# Patient Record
Sex: Female | Born: 1955 | Race: White | Hispanic: No | Smoking: Never smoker
Health system: Southern US, Community
[De-identification: ages and names within clinical notes are randomized; demographics above are authoritative.]

## PROBLEM LIST (undated history)

## (undated) DIAGNOSIS — M1990 Unspecified osteoarthritis, unspecified site: Secondary | ICD-10-CM

## (undated) DIAGNOSIS — T8859XA Other complications of anesthesia, initial encounter: Secondary | ICD-10-CM

## (undated) DIAGNOSIS — I10 Essential (primary) hypertension: Secondary | ICD-10-CM

## (undated) DIAGNOSIS — M7580 Other shoulder lesions, unspecified shoulder: Secondary | ICD-10-CM

## (undated) DIAGNOSIS — E11610 Type 2 diabetes mellitus with diabetic neuropathic arthropathy: Secondary | ICD-10-CM

## (undated) DIAGNOSIS — M7122 Synovial cyst of popliteal space [Baker], left knee: Secondary | ICD-10-CM

## (undated) DIAGNOSIS — T4145XA Adverse effect of unspecified anesthetic, initial encounter: Secondary | ICD-10-CM

## (undated) DIAGNOSIS — I739 Peripheral vascular disease, unspecified: Secondary | ICD-10-CM

## (undated) DIAGNOSIS — G709 Myoneural disorder, unspecified: Secondary | ICD-10-CM

## (undated) DIAGNOSIS — E7800 Pure hypercholesterolemia, unspecified: Secondary | ICD-10-CM

## (undated) HISTORY — PX: CHOLECYSTECTOMY: SHX55

## (undated) HISTORY — PX: OTHER SURGICAL HISTORY: SHX169

---

## 2011-09-10 ENCOUNTER — Inpatient Hospital Stay (HOSPITAL_COMMUNITY)
Admission: AD | Admit: 2011-09-10 | Discharge: 2011-09-14 | DRG: 639 | Disposition: A | Discharge status: Home / Self Care | Payer: Medicare Other | Source: Ambulatory Visit | Attending: Internal Medicine | Admitting: Internal Medicine

## 2011-09-10 ENCOUNTER — Encounter (HOSPITAL_COMMUNITY): Payer: Self-pay

## 2011-09-10 ENCOUNTER — Inpatient Hospital Stay (HOSPITAL_COMMUNITY): Payer: Medicare Other

## 2011-09-10 DIAGNOSIS — E1165 Type 2 diabetes mellitus with hyperglycemia: Principal | ICD-10-CM | POA: Diagnosis present

## 2011-09-10 DIAGNOSIS — I10 Essential (primary) hypertension: Secondary | ICD-10-CM | POA: Diagnosis present

## 2011-09-10 DIAGNOSIS — Z794 Long term (current) use of insulin: Secondary | ICD-10-CM

## 2011-09-10 DIAGNOSIS — E1149 Type 2 diabetes mellitus with other diabetic neurological complication: Secondary | ICD-10-CM | POA: Diagnosis present

## 2011-09-10 DIAGNOSIS — E1142 Type 2 diabetes mellitus with diabetic polyneuropathy: Secondary | ICD-10-CM | POA: Diagnosis present

## 2011-09-10 DIAGNOSIS — L97509 Non-pressure chronic ulcer of other part of unspecified foot with unspecified severity: Secondary | ICD-10-CM | POA: Diagnosis present

## 2011-09-10 DIAGNOSIS — E039 Hypothyroidism, unspecified: Secondary | ICD-10-CM | POA: Diagnosis present

## 2011-09-10 DIAGNOSIS — E669 Obesity, unspecified: Secondary | ICD-10-CM | POA: Diagnosis present

## 2011-09-10 DIAGNOSIS — K5900 Constipation, unspecified: Secondary | ICD-10-CM | POA: Diagnosis present

## 2011-09-10 DIAGNOSIS — E785 Hyperlipidemia, unspecified: Secondary | ICD-10-CM | POA: Diagnosis present

## 2011-09-10 DIAGNOSIS — IMO0002 Reserved for concepts with insufficient information to code with codable children: Principal | ICD-10-CM | POA: Diagnosis present

## 2011-09-10 DIAGNOSIS — M1990 Unspecified osteoarthritis, unspecified site: Secondary | ICD-10-CM | POA: Diagnosis present

## 2011-09-10 DIAGNOSIS — Z23 Encounter for immunization: Secondary | ICD-10-CM

## 2011-09-10 HISTORY — DX: Other complications of anesthesia, initial encounter: T88.59XA

## 2011-09-10 HISTORY — DX: Peripheral vascular disease, unspecified: I73.9

## 2011-09-10 HISTORY — DX: Essential (primary) hypertension: I10

## 2011-09-10 HISTORY — DX: Unspecified osteoarthritis, unspecified site: M19.90

## 2011-09-10 HISTORY — DX: Pure hypercholesterolemia, unspecified: E78.00

## 2011-09-10 HISTORY — DX: Other shoulder lesions, unspecified shoulder: M75.80

## 2011-09-10 HISTORY — DX: Type 2 diabetes mellitus with diabetic neuropathic arthropathy: E11.610

## 2011-09-10 HISTORY — DX: Myoneural disorder, unspecified: G70.9

## 2011-09-10 HISTORY — DX: Adverse effect of unspecified anesthetic, initial encounter: T41.45XA

## 2011-09-10 LAB — CBC
HCT: 36.3 % (ref 36.0–46.0)
Hemoglobin: 12.7 g/dL (ref 12.0–15.0)
MCH: 30.5 pg (ref 26.0–34.0)
MCHC: 35 g/dL (ref 30.0–36.0)
RDW: 12.4 % (ref 11.5–15.5)

## 2011-09-10 LAB — GLUCOSE, CAPILLARY
Glucose-Capillary: 424 mg/dL — ABNORMAL HIGH (ref 70–99)
Glucose-Capillary: 358 mg/dL — ABNORMAL HIGH (ref 70–99)

## 2011-09-10 MED ORDER — LISINOPRIL 10 MG PO TABS
20.0000 mg | ORAL_TABLET | Freq: Every day | ORAL | Status: DC
  Administered 2011-09-11 – 2011-09-14 (×4): 20 mg via ORAL
  Filled 2011-09-10: qty 1
  Filled 2011-09-10 (×2): qty 2
  Filled 2011-09-10 (×3): qty 1
  Filled 2011-09-10: qty 2

## 2011-09-10 MED ORDER — PNEUMOCOCCAL VAC POLYVALENT 25 MCG/0.5ML IJ INJ
0.5000 mL | INJECTION | INTRAMUSCULAR | Status: AC
  Administered 2011-09-11: 0.5 mL via INTRAMUSCULAR
  Filled 2011-09-10: qty 0.5

## 2011-09-10 MED ORDER — INSULIN GLARGINE 100 UNIT/ML ~~LOC~~ SOLN
30.0000 [IU] | Freq: Every day | SUBCUTANEOUS | Status: DC
  Administered 2011-09-10: 30 [IU] via SUBCUTANEOUS
  Filled 2011-09-10: qty 3

## 2011-09-10 MED ORDER — INSULIN ASPART 100 UNIT/ML ~~LOC~~ SOLN
0.0000 [IU] | Freq: Every day | SUBCUTANEOUS | Status: DC
  Administered 2011-09-10: 5 [IU] via SUBCUTANEOUS

## 2011-09-10 MED ORDER — SODIUM CHLORIDE 0.9 % IV SOLN
INTRAVENOUS | Status: DC
  Administered 2011-09-10: 23:00:00 via INTRAVENOUS
  Administered 2011-09-11: 75 mL via INTRAVENOUS
  Administered 2011-09-12: 04:00:00 via INTRAVENOUS

## 2011-09-10 MED ORDER — CITALOPRAM HYDROBROMIDE 20 MG PO TABS
20.0000 mg | ORAL_TABLET | Freq: Every day | ORAL | Status: DC
  Administered 2011-09-10 – 2011-09-14 (×5): 20 mg via ORAL
  Filled 2011-09-10 (×5): qty 1

## 2011-09-10 MED ORDER — GABAPENTIN 300 MG PO CAPS
600.0000 mg | ORAL_CAPSULE | Freq: Three times a day (TID) | ORAL | Status: DC
  Administered 2011-09-10 – 2011-09-14 (×11): 600 mg via ORAL
  Filled 2011-09-10 (×4): qty 2
  Filled 2011-09-10: qty 1
  Filled 2011-09-10: qty 2
  Filled 2011-09-10: qty 1
  Filled 2011-09-10: qty 2
  Filled 2011-09-10: qty 1
  Filled 2011-09-10 (×2): qty 2
  Filled 2011-09-10: qty 1
  Filled 2011-09-10: qty 2

## 2011-09-10 MED ORDER — INSULIN ASPART 100 UNIT/ML ~~LOC~~ SOLN
15.0000 [IU] | Freq: Once | SUBCUTANEOUS | Status: AC
  Administered 2011-09-10: 15 [IU] via SUBCUTANEOUS

## 2011-09-10 MED ORDER — INSULIN ASPART 100 UNIT/ML ~~LOC~~ SOLN
0.0000 [IU] | Freq: Three times a day (TID) | SUBCUTANEOUS | Status: DC
  Administered 2011-09-11: 15 [IU] via SUBCUTANEOUS
  Administered 2011-09-11: 11 [IU] via SUBCUTANEOUS
  Administered 2011-09-12 (×2): 3 [IU] via SUBCUTANEOUS
  Administered 2011-09-12: 2 [IU] via SUBCUTANEOUS
  Administered 2011-09-13: 5 [IU] via SUBCUTANEOUS
  Administered 2011-09-13: 3 [IU] via SUBCUTANEOUS
  Administered 2011-09-14: 8 [IU] via SUBCUTANEOUS
  Administered 2011-09-14: 3 [IU] via SUBCUTANEOUS
  Filled 2011-09-10: qty 3

## 2011-09-10 MED ORDER — SODIUM CHLORIDE 0.9 % IJ SOLN
3.0000 mL | INTRAMUSCULAR | Status: DC | PRN
  Filled 2011-09-10: qty 3

## 2011-09-10 MED ORDER — CYCLOBENZAPRINE HCL 10 MG PO TABS
10.0000 mg | ORAL_TABLET | Freq: Two times a day (BID) | ORAL | Status: DC
  Administered 2011-09-10 – 2011-09-14 (×8): 10 mg via ORAL
  Filled 2011-09-10 (×8): qty 1

## 2011-09-10 MED ORDER — HYDROCODONE-ACETAMINOPHEN 5-325 MG PO TABS
1.5000 | ORAL_TABLET | Freq: Four times a day (QID) | ORAL | Status: DC | PRN
  Administered 2011-09-10 – 2011-09-14 (×8): 1.5 via ORAL
  Filled 2011-09-10 (×8): qty 2

## 2011-09-11 ENCOUNTER — Inpatient Hospital Stay (HOSPITAL_COMMUNITY): Payer: Medicare Other

## 2011-09-11 LAB — GLUCOSE, CAPILLARY
Glucose-Capillary: 300 mg/dL — ABNORMAL HIGH (ref 70–99)
Glucose-Capillary: 215 mg/dL — ABNORMAL HIGH (ref 70–99)

## 2011-09-11 LAB — COMPREHENSIVE METABOLIC PANEL
Albumin: 3.4 g/dL — ABNORMAL LOW (ref 3.5–5.2)
BUN: 25 mg/dL — ABNORMAL HIGH (ref 6–23)
Calcium: 9.6 mg/dL (ref 8.4–10.5)
GFR calc Af Amer: 45 mL/min — ABNORMAL LOW (ref >60)
Glucose, Bld: 282 mg/dL — ABNORMAL HIGH (ref 70–99)
Potassium: 4.1 mEq/L (ref 3.5–5.1)
Total Protein: 7.7 g/dL (ref 6.0–8.3)

## 2011-09-11 LAB — LIPID PANEL
HDL: 40 mg/dL (ref >39)
LDL Cholesterol: 81 mg/dL (ref 0–99)
Triglycerides: 223 mg/dL — ABNORMAL HIGH (ref <150)
VLDL: 45 mg/dL — ABNORMAL HIGH (ref 0–40)

## 2011-09-11 LAB — HEMOGLOBIN A1C
Hgb A1c MFr Bld: 13.5 % — ABNORMAL HIGH (ref <5.7)
Mean Plasma Glucose: 341 mg/dL — ABNORMAL HIGH (ref <117)

## 2011-09-11 MED ORDER — INSULIN ASPART 100 UNIT/ML ~~LOC~~ SOLN
15.0000 [IU] | Freq: Three times a day (TID) | SUBCUTANEOUS | Status: DC
  Administered 2011-09-11 – 2011-09-14 (×9): 15 [IU] via SUBCUTANEOUS

## 2011-09-11 MED ORDER — INSULIN GLARGINE 100 UNIT/ML ~~LOC~~ SOLN
60.0000 [IU] | Freq: Every day | SUBCUTANEOUS | Status: DC
  Administered 2011-09-11 – 2011-09-12 (×2): 60 [IU] via SUBCUTANEOUS

## 2011-09-11 NOTE — Consult Note (Signed)
  [id number] 

## 2011-09-11 NOTE — Progress Notes (Signed)
NAME:  Theresa Estrada, CARRANZA NO.:  [id number]  MEDICAL RECORD NO.:  [id number]  LOCATION:  A325                          FACILITY:  [facility]  PHYSICIAN:  Aiman Sonn D. Felecia Shelling, MD   DATE OF BIRTH:  Jul 22, 1956  DATE OF PROCEDURE:  09/11/2011 DATE OF DISCHARGE:                                PROGRESS NOTE   SUBJECTIVE:  The patient continued to complain of right foot pain.  She has foot ulcer which is currently being dressed.  Her blood sugar is slightly coming down.  OBJECTIVE:  GENERAL:  The patient is alert, awake and chronically sick looking. VITAL SIGNS:  Blood pressure 138/78, pulse 78, respiratory rate 16, temperature 98 degrees Fahrenheit. CHEST:  Decreased air entry.  Few rhonchi. CARDIOVASCULAR SYSTEM:  First and second heart sounds heard.  No murmur. No gallop. ABDOMEN:  Soft and lax.  Bowel sound is positive.  No mass or organomegaly.  EXTREMITIES:  The patient has wound with eschar on her right foot on the plantar side.  LABORATORY DATA:  Blood sugar 282.  ASSESSMENT: 1. Diabetes mellitus type 2, poorly controlled. 2. Right foot ulcer. 3. Diabetic neuropathy. 4. Hypertension. 5. Hyperlipidemia.  PLAN:  We will continue the patient on current insulin therapy.  We will do x-ray of the foot.  Continue wound care.  Endocrine consult is requested.     Laurell Coalson D. Felecia Shelling, MD     TDF/MEDQ  D:  09/11/2011  T:  09/11/2011  Job:  [id number]

## 2011-09-11 NOTE — Progress Notes (Signed)
UR Chart Review Completed  

## 2011-09-11 NOTE — H&P (Signed)
NAME:  ARON, NEEDLES NO.:  [id number]  MEDICAL RECORD NO.:  [id number]  LOCATION:  A325                          FACILITY:  [facility]  PHYSICIAN:  Riely Baskett D. Felecia Shelling, MD   DATE OF BIRTH:  November 25, 1956  DATE OF ADMISSION:  09/10/2011 DATE OF DISCHARGE:  LH                             HISTORY & PHYSICAL   CHIEF COMPLAINTS:  Uncontrolled diabetes mellitus and right foot ulcer.  HISTORY OF PRESENT ILLNESS:  This is a 55 year old female patient with history of multiple medical illnesses who was directly admitted from the office due to the above complaints.  The patient was recently moved from [place] and she is in [place], [place] currently since July of this year.  The patient has not been able to get a primary care and she has not been seeing a physician.  She came to establish to my office yesterday and the patient was found to have a blood sugar of greater than 500.  She has also right foot nonhealing wound which is getting worse.  The patient was evaluated in the office and was directly admitted for management of her diabetes and foot ulcer.  The patient complains of pain in her lower extremities and severe neuropathy.  REVIEW OF SYSTEMS:  No fever, chills, cough, chest pain, nausea, vomiting.  No abdominal pain, dysuria, urgency, or frequency of urination.  PAST MEDICAL HISTORY: 1. Diabetes mellitus. 2. Hypertension. 3. Hyperlipidemia. 4. Severe neuropathy. 5. History of Charcot foot. 6. Osteoarthritis. 7. Obesity.  CURRENT MEDICATIONS: 1. Lisinopril/HCTZ, dose not known. 2. Flexeril 10 mg b.i.d. 3. Hydrocortisone/APAP 7.5/500 one tablet t.i.d. 4. Tramadol 50 mg b.i.d. 5. Metformin 500 mg b.i.d. 6. Lasix 20 mg daily. 7. Neurontin 500 mg daily. 8. Insulin 70/30, 55 units in a.m. and 55 units in p.m. 9. Celexa 20 mg daily. 10,  Meloxicam 7.5 mg p.o. daily.  SOCIAL HISTORY:  The patient is separated.  She has no history of alcohol, tobacco, or substance  abuse.  FAMILY HISTORY:  Her father and grandfather has diabetes mellitus.  No known disease on her maternal side.  PHYSICAL EXAMINATION:  GENERAL:  The patient is alert and awake, and chronically sick looking. VITAL SIGNS:  On admission, blood pressure 140/80, pulse 88, respiratory rate 20, temperature 98 degrees Fahrenheit. HEENT:  Pupils are equal and reactive. NECK:  Supple. CHEST:  Decreased air entry, few rhonchi. CARDIOVASCULAR:  First and second heart sounds heard.  No murmur, no gallop. ABDOMEN:  Soft and lax.  Obese.  No mass or organomegaly. EXTREMITIES:  The patient has wound on her plantar side of her right foot with some eschar and swelling.  LABS ON ADMISSION:  Glucose initially 424, sodium 133, potassium 4.1, chloride 95, carbon dioxide 27, BUN 25, creatinine 1.46, calcium 9.6, total protein 7.7, albumin 3.4.  Hemoglobin A1c result pending.  CBC: WBC 10.4, hemoglobin 12.7, hematocrit 36.3, and platelets 313.  ASSESSMENT: 1. Diabetes mellitus type 2, poorly controlled. 2. Diabetic foot ulcer. 3. Severe diabetic neuropathy. 4. Hypertension. 5. Hyperlipidemia. 6. Obesity. 7. Osteoarthritis.  PLAN:  We will continue the patient on sliding scale insulin and Lantus insulin at bedtime.  We will initiate wound care.  We will do Endocrine consult and we will do Wound Care consult as well.  We will continue the patient on her regular medications, and we will continue to monitor her electrolytes and blood sugar level.     John Vasconcelos D. Felecia Shelling, MD     TDF/MEDQ  D:  09/11/2011  T:  09/11/2011  Job:  [id number]

## 2011-09-11 NOTE — Consult Note (Signed)
Pt with history of bilateral Charcot foot and degenerative changes and chronic full thickness wound to right plantar foot near bottom of great toe area.  She has previously been followed by a podiatrist in [place] but moved here recently and has not had any follow-up care since July.  She has worn a contact cast in the past to promote healing, and states in the past several weeks, her wound became larger and had mod tan drainage and odor.  She has hx of  neuropathy.  Xray does not indicate osteomyelitis, but shows possible gout changes.  Pt admits that she has seen small crystals in the wound bed at times but did not know what they were. None visible at present.  Wound full thickness, 2X2X.3cm, 90% red with dark black rim and callous formation surrounding wound bed.  Trimmed callous with scalpel and removed black edges without pain or bleeding. Mod tan drainage, slight odor.  Plan:  Aquacel to absorb drainage and provide antimicrobial benefits.  Pt could benefit from a referral to a podiatrist or outpatient wound care center upon D/C.  Will not plan to follow further unless reconsulted.  Thank-you, Cammie Mcgee MSN, [employer]

## 2011-09-11 NOTE — Plan of Care (Signed)
Problem: Consults Goal: Diagnosis-Diabetes Mellitus Outcome: Progressing Hyperglycemia     

## 2011-09-12 LAB — BASIC METABOLIC PANEL
CO2: 27 mEq/L (ref 19–32)
Chloride: 102 mEq/L (ref 96–112)
Creatinine, Ser: 0.86 mg/dL (ref 0.50–1.10)
GFR calc Af Amer: >60 mL/min (ref >60)
Potassium: 5 mEq/L (ref 3.5–5.1)

## 2011-09-12 LAB — GLUCOSE, CAPILLARY
Glucose-Capillary: 187 mg/dL — ABNORMAL HIGH (ref 70–99)
Glucose-Capillary: 147 mg/dL — ABNORMAL HIGH (ref 70–99)
Glucose-Capillary: 112 mg/dL — ABNORMAL HIGH (ref 70–99)

## 2011-09-12 MED ORDER — LEVOTHYROXINE SODIUM 50 MCG PO TABS
50.0000 ug | ORAL_TABLET | Freq: Every day | ORAL | Status: DC
  Administered 2011-09-12 – 2011-09-14 (×3): 50 ug via ORAL
  Filled 2011-09-12 (×3): qty 1

## 2011-09-12 MED ORDER — MAGNESIUM HYDROXIDE 400 MG/5ML PO SUSP
60.0000 mL | Freq: Every day | ORAL | Status: DC | PRN
  Administered 2011-09-12: 60 mL via ORAL
  Filled 2011-09-12: qty 60

## 2011-09-12 NOTE — Consult Note (Signed)
NAME:  Theresa, Estrada NO.:  [id number]  MEDICAL RECORD NO.:  [id number]  LOCATION:  A325                          FACILITY:  [facility]  PHYSICIAN:  Purcell Nails, MD DATE OF BIRTH:  23-Feb-1956  DATE OF CONSULTATION: DATE OF DISCHARGE:                                CONSULTATION   REASON FOR CONSULTATION:  Uncontrolled type 2 diabetes.  HISTORY OF PRESENT ILLNESS:  This is a 55 year old Caucasian female with multiple medical problems including type 2 diabetes since 2007, complicated by peripheral arterial disease, Charcot arthritis with recent hemoglobin A1c of 10+%.  The patient was admitted to her primary care physician's office yesterday with complaint of diabetes and also uncontrolled hyperglycemia.  The patient reports that she recently moved from [place] and residing near [place].  She has been on insulin Novolin 70/30 55 units t.i.d. and metformin 1000 mg b.i.d. since the diagnosis of diabetes since 2007.  The patient was found to have nonhealing foot ulcer in the office.  She admits that she does not monitor her blood sugar regularly.  She has family history of diabetes in her father and grandmother, and has not seen endocrinologist in the past.  PAST MEDICAL HISTORY:  As above including type 2 diabetes, hypertension, Charcot arthritis, hyperlipidemia, severe neuropathy, osteoarthritis and obesity.  HOME MEDICATIONS:  Lisinopril, Flexeril, hydrocodone, tramadol, metformin, Lasix, Neurontin, Novolin 70/30 55 units t.i.d., Celexa, meloxicam.  SOCIAL HISTORY:  Negative for smoking, alcohol, or drug use.  FAMILY HISTORY:  Significant for type 2 diabetes in her father and grandmother and also premature coronary artery disease in her father.  PHYSICAL EXAMINATION:  GENERAL:  She is alert and oriented x3, stable in her postop day.  No acute distress. VITAL SIGNS:  Showed blood pressure of 140/80, pulse 88. HEENT:  No icterus, no pallor, atraumatic. NECK:   Thick, otherwise nontender.  No thyromegaly. CHEST:  Clear to auscultation bilaterally; however, decreased air entry. CARDIOVASCULAR:  Distant heart sounds.  No murmur.  No gallop. ABDOMEN:  Obese, no tenderness, no organomegaly. EXTREMITIES:  She has superficial-looking circular diabetic foot ulcer on her right lower extremity and clearly deformed Charcot foot on bilateral feet. CNS:  Nonfocal; however, diminished monofilament test.  Her admission laboratory exam shows blood glucose is 400, sodium 133, potassium 4.9, chloride 99, BUN 25, creatinine 1.46.  Hemoglobin A1c pending.  ASSESSMENT: 1. Uncontrolled type 2 diabetes was complicated by diabetic foot ulcer     and Charcot foot and severe peripheral neuropathy. 2. Hypertension. 3. Hyperlipidemia. 4. Obesity. 5. Osteoarthritis.  PLAN:  We will continue bolus insulin in the hospital; however, increasing Lantus to 60 units nightly and initiate NovoLog 15 units t.i.d. a.c. along with moderate dose sliding scale using the NovoLog. Avoid oral antidiabetic in house.  Depending on her fingerstick readings and A1c, her insulin regimen will be adjusted.  I have ordered the patient outpatient follow up in 1 week time to check and control her diabetes to avoid complications including worsening peripheral arterial disease, coronary artery disease, CVA and EKG which are all discussed in detail with her.  I have consider for dietary precautions in relation to her diabetes as well.  Dear Dr.  Fanta, thank your for the opportunity to participate in the care of this pleasant patient.  I will update you her progress.          ______________________________ Purcell Nails, MD     GN/MEDQ  D:  09/11/2011  T:  09/12/2011  Job:  [id number]

## 2011-09-12 NOTE — Progress Notes (Signed)
Subjective: F/u for uncontrolled type 2 DM. She has no new complaints. Objective:  Vital signs in last 24 hours: Filed Vitals:   09/11/11 1400 09/11/11 2218 09/12/11 0500  BP: 110/69 154/114 135/83  Pulse: 66 86 66  Temp:  97.6 F (36.4 C) 97.4 F (36.3 C)  TempSrc:  Axillary Oral  Resp: 18 20   SpO2: 96% 97% 93%    Intake/Output Summary (Last 24 hours) at 09/12/11 6045 Last data filed at 09/11/11 2100  Gross per 24 hour  Intake 1946.25 ml  Output    500 ml  Net 1446.25 ml   Weight change:   GENERAL: She is alert and oriented x3, stable in  her postop day. No acute distress.  VITAL SIGNS: Showed blood pressure of 140/80, pulse 88.  HEENT: No icterus, no pallor, atraumatic.  NECK: Thick, otherwise nontender. No thyromegaly.  CHEST: Clear to auscultation bilaterally; however, decreased air entry.  CARDIOVASCULAR: Distant heart sounds. No murmur. No gallop.  ABDOMEN: Obese, no tenderness, no organomegaly.  EXTREMITIES: She has superficial-looking circular diabetic foot ulcer  on her right lower extremity and clearly deformed Charcot foot on  bilateral feet.  CNS: Nonfocal; however, diminished monofilament test.   Lab Results: Basic Metabolic Panel:  Basename 09/12/11 0546 09/10/11 2316  NA 136 133*  K 5.0 4.1  CL 102 95*  CO2 27 27  GLUCOSE 193* 282*  BUN 23 25*  CREATININE 0.86 1.46*  CALCIUM 9.2 9.6  MG -- --  PHOS -- --   Liver Function Tests:  Basename 09/10/11 2316  AST 13  ALT 15  ALKPHOS 122*  BILITOT 0.2*  PROT 7.7  ALBUMIN 3.4*    CBC:  Basename 09/10/11 2316  WBC 10.4  NEUTROABS --  HGB 12.7  HCT 36.3  MCV 87.1  PLT 313    Basename 09/12/11 0741 09/11/11 2102 09/11/11 1654 09/11/11 1142 09/11/11 0757 09/10/11 2150  GLUCAP 187* 233* 215* 359* 300* 358*   Hemoglobin A1C:  Basename 09/10/11 2316  HGBA1C 13.5*   Fasting Lipid Panel:  Basename 09/11/11 1001  CHOL 166  HDL 40  LDLCALC 81  TRIG 223*  CHOLHDL 4.2  LDLDIRECT  --   Thyroid Function Tests:  Basename 09/11/11 1001  TSH 7.195*  T4TOTAL --  T3FREE --  THYROIDAB --     Medications:  Scheduled Meds:   . citalopram  20 mg Oral Daily  . cyclobenzaprine  10 mg Oral BID  . gabapentin  600 mg Oral TID  . insulin aspart  0-15 Units Subcutaneous TID WC  . insulin aspart  15 Units Subcutaneous TID WC  . insulin glargine  60 Units Subcutaneous QHS  . lisinopril  20 mg Oral Daily  . pneumococcal 23 valent vaccine  0.5 mL Intramuscular Tomorrow-1000  . DISCONTD: insulin aspart  0-5 Units Subcutaneous QHS  . DISCONTD: insulin glargine  30 Units Subcutaneous QHS   Continuous Infusions:   . DISCONTD: sodium chloride 75 mL/hr at 09/12/11 0427   PRN Meds:.HYDROcodone-acetaminophen, sodium chloride  Assessment/Plan: 1. Uncontrolled type 2 diabetes was complicated by diabetic foot ulcer  and Charcot foot and severe peripheral neuropathy.  2. Hypertension.  3. Hyperlipidemia.  4. Obesity.  5. Osteoarthritis. 6. Hypothyroidism   Plan: continue current insulin therapy. Will initiate Levothyroxine 50 mcg po qam. Will continue to follow.   LOS: 2 days   Theresa Estrada 09/12/2011, 8:21 AM

## 2011-09-12 NOTE — Progress Notes (Addendum)
INITIAL ADULT NUTRITION ASSESSMENT Date: 09/12/2011   Time: 8:55 AM Reason for Assessment: Poorly controlled DM  ASSESSMENT: Female 55 y.o.  Dx: <principal problem not specified> Hyperglycemia   Past Medical History  Diagnosis Date  . Complication of anesthesia   . Diabetes mellitus   . Hypertension   . Peripheral vascular disease   . Arthritis   . Hypercholesterolemia   . Charcot foot due to diabetes mellitus to both feet  . Neuromuscular disorder   . 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 citalopram  20 mg Oral Daily  . cyclobenzaprine  10 mg Oral BID  . gabapentin  600 mg Oral TID  . insulin aspart  0-15 Units Subcutaneous TID WC  . insulin aspart  15 Units Subcutaneous TID WC  . insulin glargine  60 Units Subcutaneous QHS  . levothyroxine  50 mcg Oral QAC breakfast  . lisinopril  20 mg Oral Daily  . pneumococcal 23 valent vaccine  0.5 mL Intramuscular Tomorrow-1000  . DISCONTD: insulin aspart  0-5 Units Subcutaneous QHS  . DISCONTD: insulin glargine  30 Units Subcutaneous QHS   Continuous Infusions:   . DISCONTD: sodium chloride 75 mL/hr at 09/12/11 0427   PRN Meds:.HYDROcodone-acetaminophen, sodium chloride  Ht:  69"  Wt:  300#  Ideal Wt:    145# % Ideal Wt: 207%  Usual Wt: Pt reports hx of intentional wt loss  100# % Usual Wt: N/A  There is no height or weight on file to calculate BMI. BMI=44.4   Food/Nutrition Related Hx: Pt reports home diet as Regular. H/O intentional wt loss of 100#. Pt has diabetic neuropathy and charcot foot and is to follow up with [facility]. She usually eats 3 meals per day and HS snack daily. She limits fried foods and bakes, broils or grills meats and enjoys a variety of fruits and vegetables. She has dx of DM and has knowledge deficit r/t consistant carbohydrate meal planning. She would benefit from cont gradual wt loss given her BMI=>40. Please note that height and weight were provided by pt.  OP Nutrition Consult ordered and OP RD aware.   BMET    Component Value Date/Time   NA 136 09/12/2011 0546   K 5.0 09/12/2011 0546   CL 102 09/12/2011 0546   CO2 27 09/12/2011 0546   GLUCOSE 193* 09/12/2011 0546   BUN 23 09/12/2011 0546   CREATININE 0.86 09/12/2011 0546   CALCIUM 9.2 09/12/2011 0546   GFRNONAA >60 09/12/2011 0546   GFRAA >60 09/12/2011 0546   CBC    Component Value Date/Time   WBC 10.4 09/10/2011 2316   RBC 4.17 09/10/2011 2316   HGB 12.7 09/10/2011 2316   HCT 36.3 09/10/2011 2316   PLT 313 09/10/2011 2316   MCV 87.1 09/10/2011 2316   MCH 30.5 09/10/2011 2316   MCHC 35.0 09/10/2011 2316   RDW 12.4 09/10/2011 2316   CBG (last 3)   Basename 09/12/11 0741 09/11/11 2102 09/11/11 1654  GLUCAP 187* 233* 215*    Intake/Output Summary (Last 24 hours) at 09/12/11 0907 Last data filed at 09/12/11 0848  Gross per 24 hour  Intake 2546.25 ml  Output    500 ml  Net 2046.25 ml    Diet Order: Carb Control CHO Mod Med (po's >75% meals)  Supplements/Tube Feeding:N/A  IVF:    DISCONTD: sodium chloride Last Rate: 75 mL/hr at 09/12/11 0427    Estimated Nutritional Needs:   Kcal:1400-1600/d (  to promote gradual wt loss) Protein:80-90 grams/d Fluid:> 1500 ml/d  NUTRITION DIAGNOSIS: -Food and nutrition related knowledge deficit (NB-1.1).  Status: Resolved  RELATED TO:  -Lack of clear understanding of Carb Mod guidelines  AS EVIDENCE BY:  -Poorly controlled DM  MONITORING/EVALUATION(Goals): -Pt will cont with >75% meal intake and improve glycemic control.  EDUCATION NEEDS: -Education needs addressed r/t meal planning and protein needs  INTERVENTION: -Provided CHO Mod diet education with handouts. Pt demonstrated understanding and expect good adherence to diet guidelines. -Obtain current wt.  Dietitian [phone]  DOCUMENTATION CODES Per approved criteria  -Morbid Obesity    Francene Boyers 09/12/2011, 8:55 AM

## 2011-09-12 NOTE — Progress Notes (Signed)
NAME:  MEILING, HENDRIKS NO.:  [id number]  MEDICAL RECORD NO.:  [id number]  LOCATION:  A325                          FACILITY:  [facility]  PHYSICIAN:  Ellen Mayol D. Felecia Shelling, MD   DATE OF BIRTH:  1956/04/08  DATE OF PROCEDURE:  09/12/2011 DATE OF DISCHARGE:                                PROGRESS NOTE   SUBJECTIVE:  The patient feels slightly better.  Her blood sugar is gradually improving.  Her pain is controlled.  OBJECTIVE:  GENERAL:  The patient is alert, awake, and resting. VITAL SIGNS:  Blood pressure 110/69, pulse 66, respiratory rate 18, temperature 97.4 degrees Fahrenheit. CHEST:  Clear lung fields.  Good air entry. CARDIOVASCULAR SYSTEM:  First and second heart sounds heard.  No murmur. No gallop. ABDOMEN:  Soft and lax.  Bowel sound is positive.  No mass or organomegaly. EXTREMITIES:  No leg edema.  The patient has eschar on her right foot on the plantar side.  LABORATORY DATA:  Hemoglobin A1c 13.5 and mean plasma glucose level 341. Sodium 136, potassium 5.0, chloride 102, carbon dioxide 27, glucose 193, BUN 23, creatinine 0.6, calcium 9.2.  ASSESSMENT: 1. Poorly controlled diabetes mellitus type 2. 2. Right foot diabetic ulcer. 3. Hypertension. 4. Prerenal azotemia, clinically improving. 5. Obesity.  PLAN:  We will continue the patient on current insulin therapy according to endocrine recommendation.  Continue wound care.  We will discontinue IV fluids and continue current treatment and supportive care.     Ottie Neglia D. Felecia Shelling, MD     TDF/MEDQ  D:  09/12/2011  T:  09/12/2011  Job:  [id number]

## 2011-09-13 LAB — GLUCOSE, CAPILLARY

## 2011-09-13 MED ORDER — BISACODYL 5 MG PO TBEC
5.0000 mg | DELAYED_RELEASE_TABLET | Freq: Every day | ORAL | Status: DC | PRN
  Administered 2011-09-13: 5 mg via ORAL
  Filled 2011-09-13: qty 1

## 2011-09-13 MED ORDER — POLYETHYLENE GLYCOL 3350 17 G PO PACK
17.0000 g | PACK | Freq: Every day | ORAL | Status: DC
  Administered 2011-09-13 – 2011-09-14 (×2): 17 g via ORAL
  Filled 2011-09-13 (×2): qty 1

## 2011-09-13 MED ORDER — INSULIN GLARGINE 100 UNIT/ML ~~LOC~~ SOLN
70.0000 [IU] | Freq: Every day | SUBCUTANEOUS | Status: DC
  Administered 2011-09-13: 70 [IU] via SUBCUTANEOUS

## 2011-09-13 NOTE — Plan of Care (Signed)
Problem: Consults Goal: Diabetes Guidelines if Diabetic/Glucose > 140 If diabetic or lab glucose is > 140 mg/dl - Initiate Diabetes/Hyperglycemia Guidelines & Document Interventions  Outcome: Completed/Met Date Met:  09/13/11 Path reviewed and patient assessed.  Dr. Fransico Him following patient.  Outpatient Diabetes Education information given to patient.  RD consult completed as well.

## 2011-09-13 NOTE — Progress Notes (Signed)
NAME:  Theresa Estrada, Theresa Estrada NO.:  [id number]  MEDICAL RECORD NO.:  [id number]  LOCATION:  A325                          FACILITY:  [facility]  PHYSICIAN:  Yovany Clock D. Felecia Shelling, MD   DATE OF BIRTH:  06-Aug-1956  DATE OF PROCEDURE:  09/13/2011 DATE OF DISCHARGE:                                PROGRESS NOTE   SUBJECTIVE:  Patient complains of constipation.  She did not move her bowel in 6 days.  Her blood sugar is improving.  She has also pain in her knees.  OBJECTIVE:  GENERAL:  The patient is alert, awake, and resting.  Not in any form of distress. VITAL SIGNS:  Blood pressure 103/57, pulse 77, respiratory rate 20, temperature 98.3 degrees Fahrenheit. CHEST:  Decreased air entry, few rhonchi. CARDIOVASCULAR SYSTEM:  First and second heart sounds heard.  No murmur. No gallop. ABDOMEN:  Soft and lax, obese.  No mass or organomegaly. EXTREMITIES:  The patient has dressed wound in her right foot.  LABORATORY DATA:  Blood sugar 112, and TSH 7.195.  ASSESSMENT: 1. Diabetes mellitus, clinically improving. 2. Hypothyroidism. 3. Obesity. 4. Constipation. 5. Diabetic foot ulcer.  PLAN:  We will continue controlling her diabetes.  We will continue wound care.  We will start patient on MiraLax for her constipation.  We will continue current treatment according to Endocrine recommendation.     Levone Otten D. Felecia Shelling, MD     TDF/MEDQ  D:  09/13/2011  T:  09/13/2011  Job:  [id number]

## 2011-09-14 LAB — GLUCOSE, CAPILLARY: Glucose-Capillary: 284 mg/dL — ABNORMAL HIGH (ref 70–99)

## 2011-09-14 MED ORDER — INSULIN ASPART 100 UNIT/ML ~~LOC~~ SOLN: 15.0000 [IU] | Freq: Three times a day (TID) | SUBCUTANEOUS | Status: AC

## 2011-09-14 MED ORDER — LEVOTHYROXINE SODIUM 50 MCG PO TABS: 50.0000 ug | ORAL_TABLET | Freq: Every day | ORAL | Status: DC

## 2011-09-14 MED ORDER — GABAPENTIN 300 MG PO CAPS: 600.0000 mg | ORAL_CAPSULE | Freq: Three times a day (TID) | ORAL | Status: DC

## 2011-09-14 MED ORDER — INSULIN GLARGINE 100 UNIT/ML ~~LOC~~ SOLN: 70.0000 [IU] | Freq: Every day | SUBCUTANEOUS | Status: AC

## 2011-09-14 MED ORDER — HYDROCODONE-ACETAMINOPHEN 5-325 MG PO TABS: 1.5000 | ORAL_TABLET | Freq: Four times a day (QID) | ORAL | Status: AC | PRN

## 2011-09-14 MED ORDER — CYCLOBENZAPRINE HCL 10 MG PO TABS: 10.0000 mg | ORAL_TABLET | Freq: Two times a day (BID) | ORAL | Status: AC

## 2011-09-14 NOTE — Progress Notes (Signed)
Subjective: F/u for uncontrolled type 2 DM. She has no new complaints.   Objective: Vital signs in last 24 hours: Filed Vitals:   09/13/11 1400 09/13/11 2212 09/14/11 0556 09/14/11 0602  BP: 104/66 164/75 161/76   Pulse: 78 83 76   Temp: 98.1 F (36.7 C) 98.8 F (37.1 C) 98.2 F (36.8 C)   TempSrc: Oral Oral Oral   Resp: 20 20 20    Height:    5\' 9"  (1.753 m)  Weight:    136.079 kg (300 lb)  SpO2: 98% 97% 96%     Intake/Output Summary (Last 24 hours) at 09/14/11 1610 Last data filed at 09/14/11 0556  Gross per 24 hour  Intake   1300 ml  Output   1550 ml  Net   -250 ml   Weight change:   GENERAL: She is alert and oriented x3, stable in  her postop day. No acute distress.  VITAL SIGNS: Showed blood pressure of 140/80, pulse 88.  HEENT: No icterus, no pallor, atraumatic.  NECK: Thick, otherwise nontender. No thyromegaly.  CHEST: Clear to auscultation bilaterally; however, decreased air entry.  CARDIOVASCULAR: Distant heart sounds. No murmur. No gallop.  ABDOMEN: Obese, no tenderness, no organomegaly.  EXTREMITIES: She has superficial-looking circular diabetic foot ulcer  on her right lower extremity and clearly deformed Charcot foot on  bilateral feet.  CNS: Nonfocal; however, diminished monofilament test.   Lab Results: Basic Metabolic Panel:  Basename 09/12/11 0546  NA 136  K 5.0  CL 102  CO2 27  GLUCOSE 193*  BUN 23  CREATININE 0.86  CALCIUM 9.2  MG --  PHOS --    :  Basename 09/14/11 0728 09/13/11 2055 09/13/11 1635 09/13/11 1123 09/13/11 0725 09/12/11 2102  GLUCAP 284* 207* 92 182* 221* 112*   Hemoglobin A1C: 13.5% Fasting Lipid Panel:  Basename 09/11/11 1001  CHOL 166  HDL 40  LDLCALC 81  TRIG 223*  CHOLHDL 4.2  LDLDIRECT --   Thyroid Function Tests:  Basename 09/11/11 1001  TSH 7.195*  T4TOTAL --  T3FREE --  THYROIDAB --     Medications:  Scheduled Meds:   . citalopram  20 mg Oral Daily  . cyclobenzaprine  10 mg Oral BID  .  gabapentin  600 mg Oral TID  . insulin aspart  0-15 Units Subcutaneous TID WC  . insulin aspart  15 Units Subcutaneous TID WC  . insulin glargine  70 Units Subcutaneous QHS  . levothyroxine  50 mcg Oral QAC breakfast  . lisinopril  20 mg Oral Daily  . polyethylene glycol  17 g Oral Daily   Continuous Infusions:  PRN Meds:.bisacodyl, HYDROcodone-acetaminophen, magnesium hydroxide, sodium chloride  Assessment/Plan: 1. Uncontrolled type 2 diabetes was complicated by diabetic foot ulcer  and Charcot foot and severe peripheral neuropathy. 2. Hypothyroidism  Plan: continue current insulin therapy.  Lantus 70 units qhs, Novolog 15 units TIDAC plus SSI.  Will continue  Levothyroxine 50 mcg po qam.  Will continue to follow.   LOS: 4 days   Aleecia Tapia 09/14/2011, 8:23 AM

## 2011-09-14 NOTE — Discharge Summary (Signed)
NAME:  Theresa Estrada, Theresa Estrada NO.:  [id number]  MEDICAL RECORD NO.:  [id number]  LOCATION:  A325                          FACILITY:  [facility]  PHYSICIAN:  Moiz Ryant D. Felecia Shelling, MD   DATE OF BIRTH:  09/05/1956  DATE OF ADMISSION:  09/10/2011 DATE OF DISCHARGE:  09/21/2012LH                              DISCHARGE SUMMARY   DISCHARGE DIAGNOSIS: 1. Poorly controlled diabetes mellitus. 2. Diabetic foot ulcer. 3. Hypothyroidism. 4. Hypertension. 5. Hyperlipidemia. 6. Osteoarthritis. 7. Obesity.  DISCHARGE MEDICATIONS: 1. Gabapentin 600 mg p.o. t.i.d. 2. Lantus insulin 70 units subcu daily. 3. NovoLog insulin 50 units 3 times with meals. 4. Norco 5/325 one tablet p.o. q.6 h. 5. Levothyroxine 15 mcg daily. 6. Celexa 20 mg daily. 7. Lisinopril/hydrochlorothiazide 20/12.5 one tablet p.o. daily. 8. Pravastatin 40 mg p.o. daily.  DISPOSITION:  The patient will be discharged to home.  She will be followed by a podiatrist in [place] and I will see her in office one in 1 week duration.  The patient will be also followed by endocrinologist.  LABORATORY DATA ON DISCHARGE:  Glucose 207, sodium 136, potassium 5.0, chloride 102, carbon dioxide 27, glucose 193, BUN 23, creatinine 0.86, calcium 9.2.  HOSPITAL COURSE:  This is a 55 years old female patient with history of multiple medical illnesses who was admitted due to poorly controlled diabetes mellitus and right foot ulcer.  She was admitted and was started on combination of short-acting and long-acting insulin.  She was seen by endocrinologist.  Her medications were adjusted.  She was also started on levothyroxine due to hypothyroidism. Her blood sugar is more or less currently controlled.  She will be discharged home and will be followed by a podiatrist and endocrinologist.  We will continue current treatment.Ninetta Lights D. Felecia Shelling, MD     TDF/MEDQ  D:  09/14/2011  T:  09/14/2011  Job:  [id number]

## 2011-09-14 NOTE — Progress Notes (Signed)
Discharge summary: a/o.vss. Up ad lib. Saline lock removed. Discharge instructions given. Pt verbalized understanding of instructions. Left floor via wheelchair with nursing staff and family member.

## 2012-10-31 IMAGING — CR DG CHEST 2V
2 series · 2 of 2 positions shown · non-contrast
Comparison: None.

CLINICAL DATA: Hyperglycemia

CHEST - 2 VIEW

[view not recorded (1 of 2)]
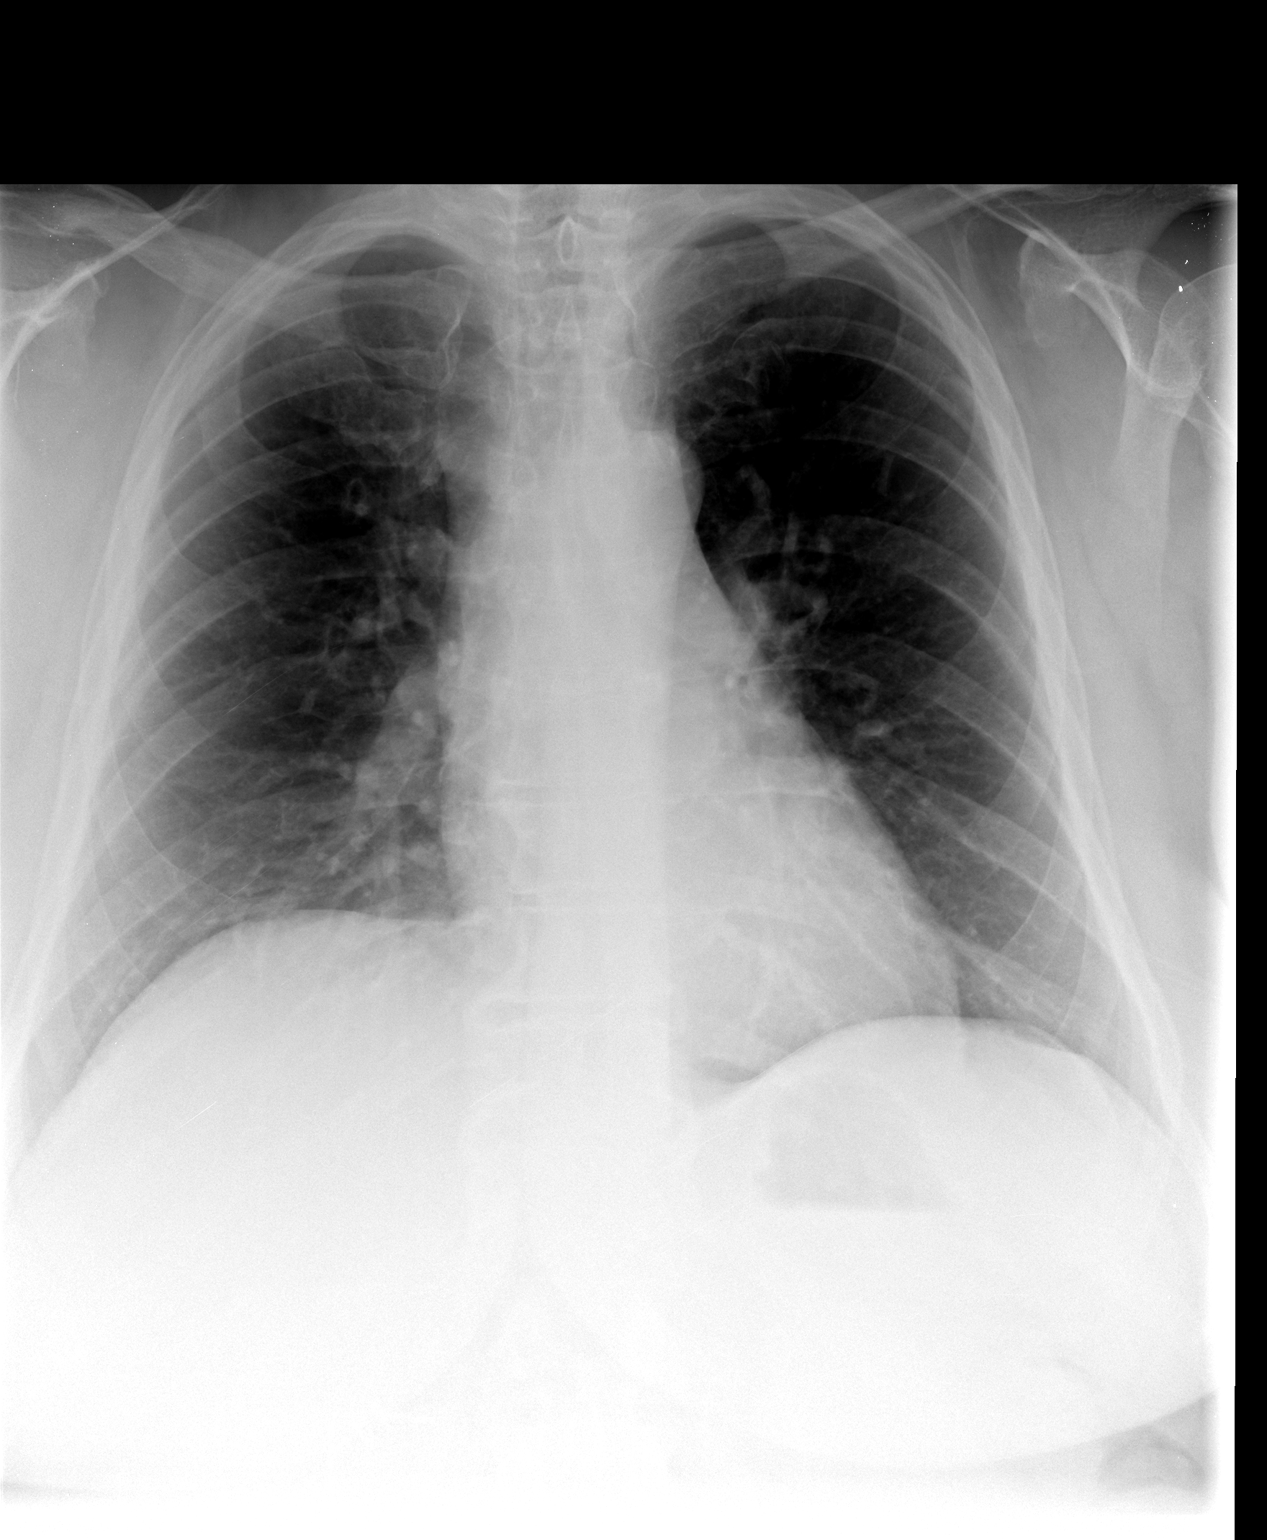

[view not recorded (2 of 2)]
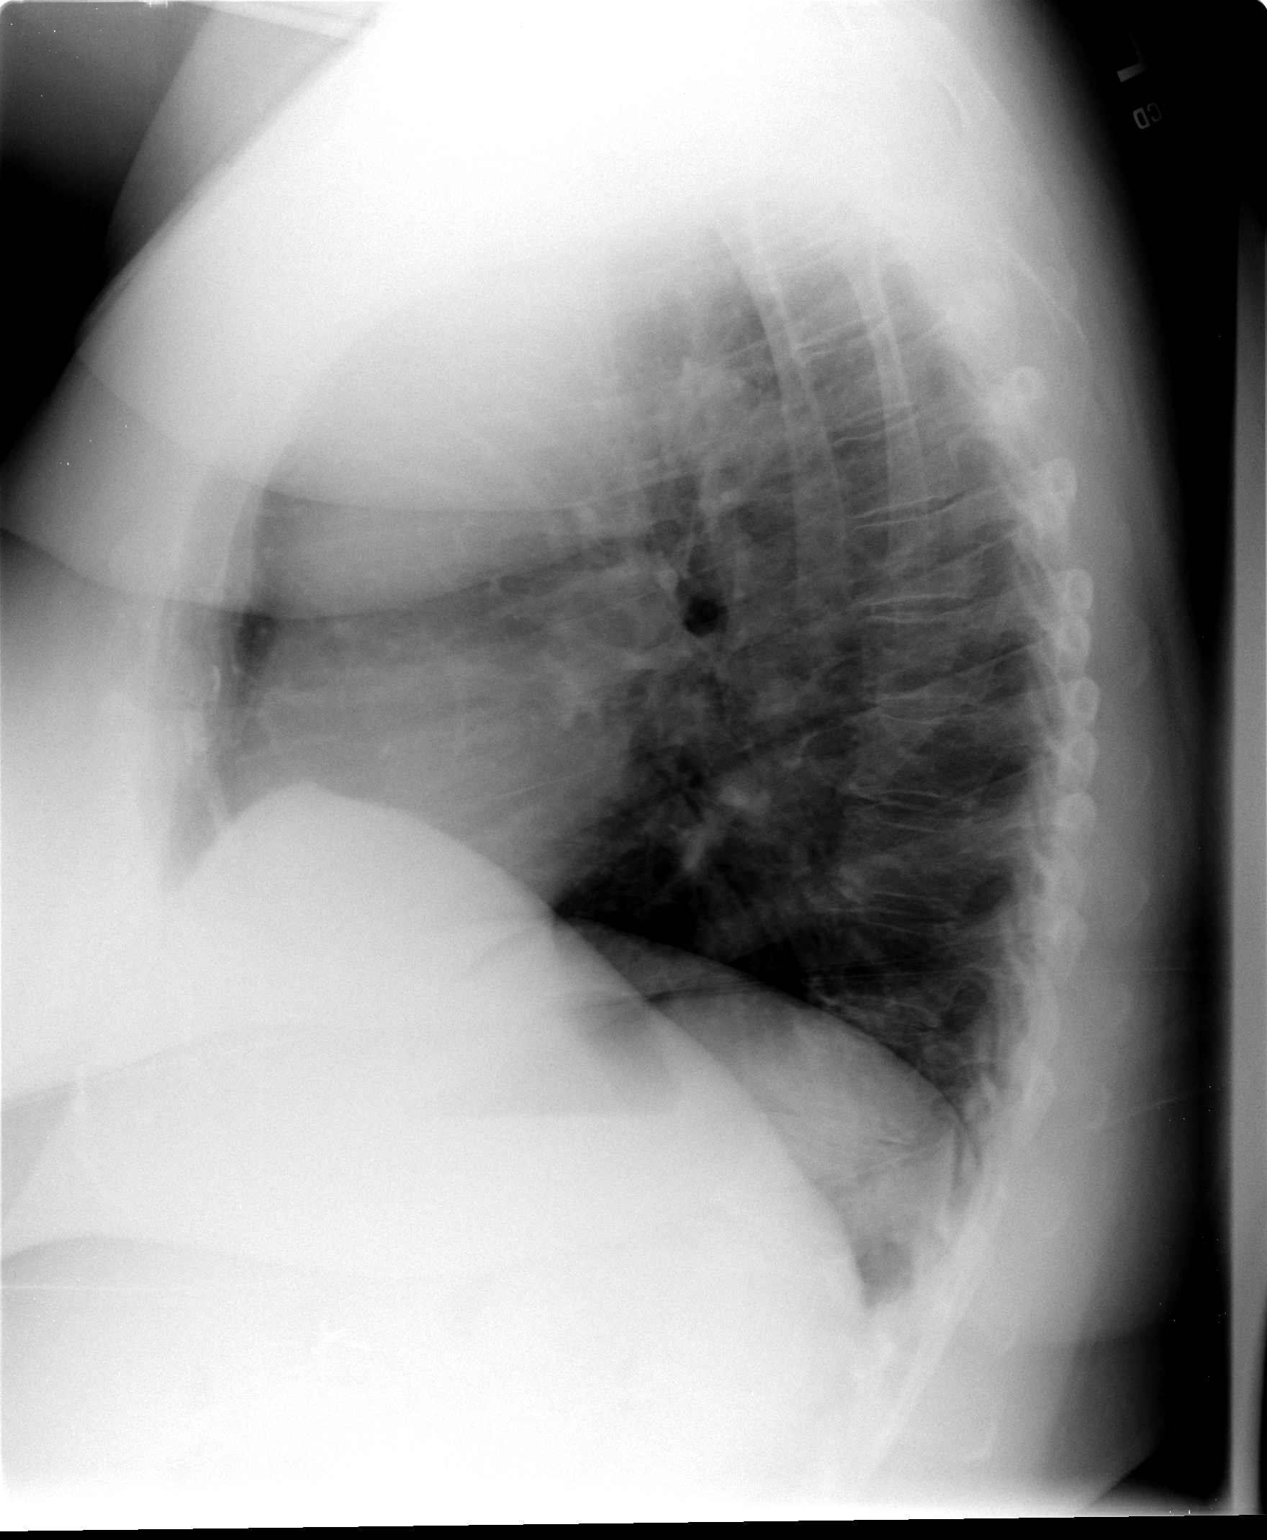

[2 of 2 positions shown; findings below may reference images not displayed]

FINDINGS: Lungs are clear. No pleural effusion or pneumothorax. The
cardiomediastinal contours are within normal limits. The visualized
bones and soft tissues are without significant appreciable
abnormality. Surgical clips right upper quadrant.
IMPRESSION: No acute cardiopulmonary process.

## 2012-11-01 IMAGING — CR DG FOOT 2V*R*
2 series · 2 of 2 positions shown · non-contrast
Comparison: None

CLINICAL DATA: Right foot pain and swelling.

RIGHT FOOT - 2 VIEW

[view not recorded (1 of 2)]
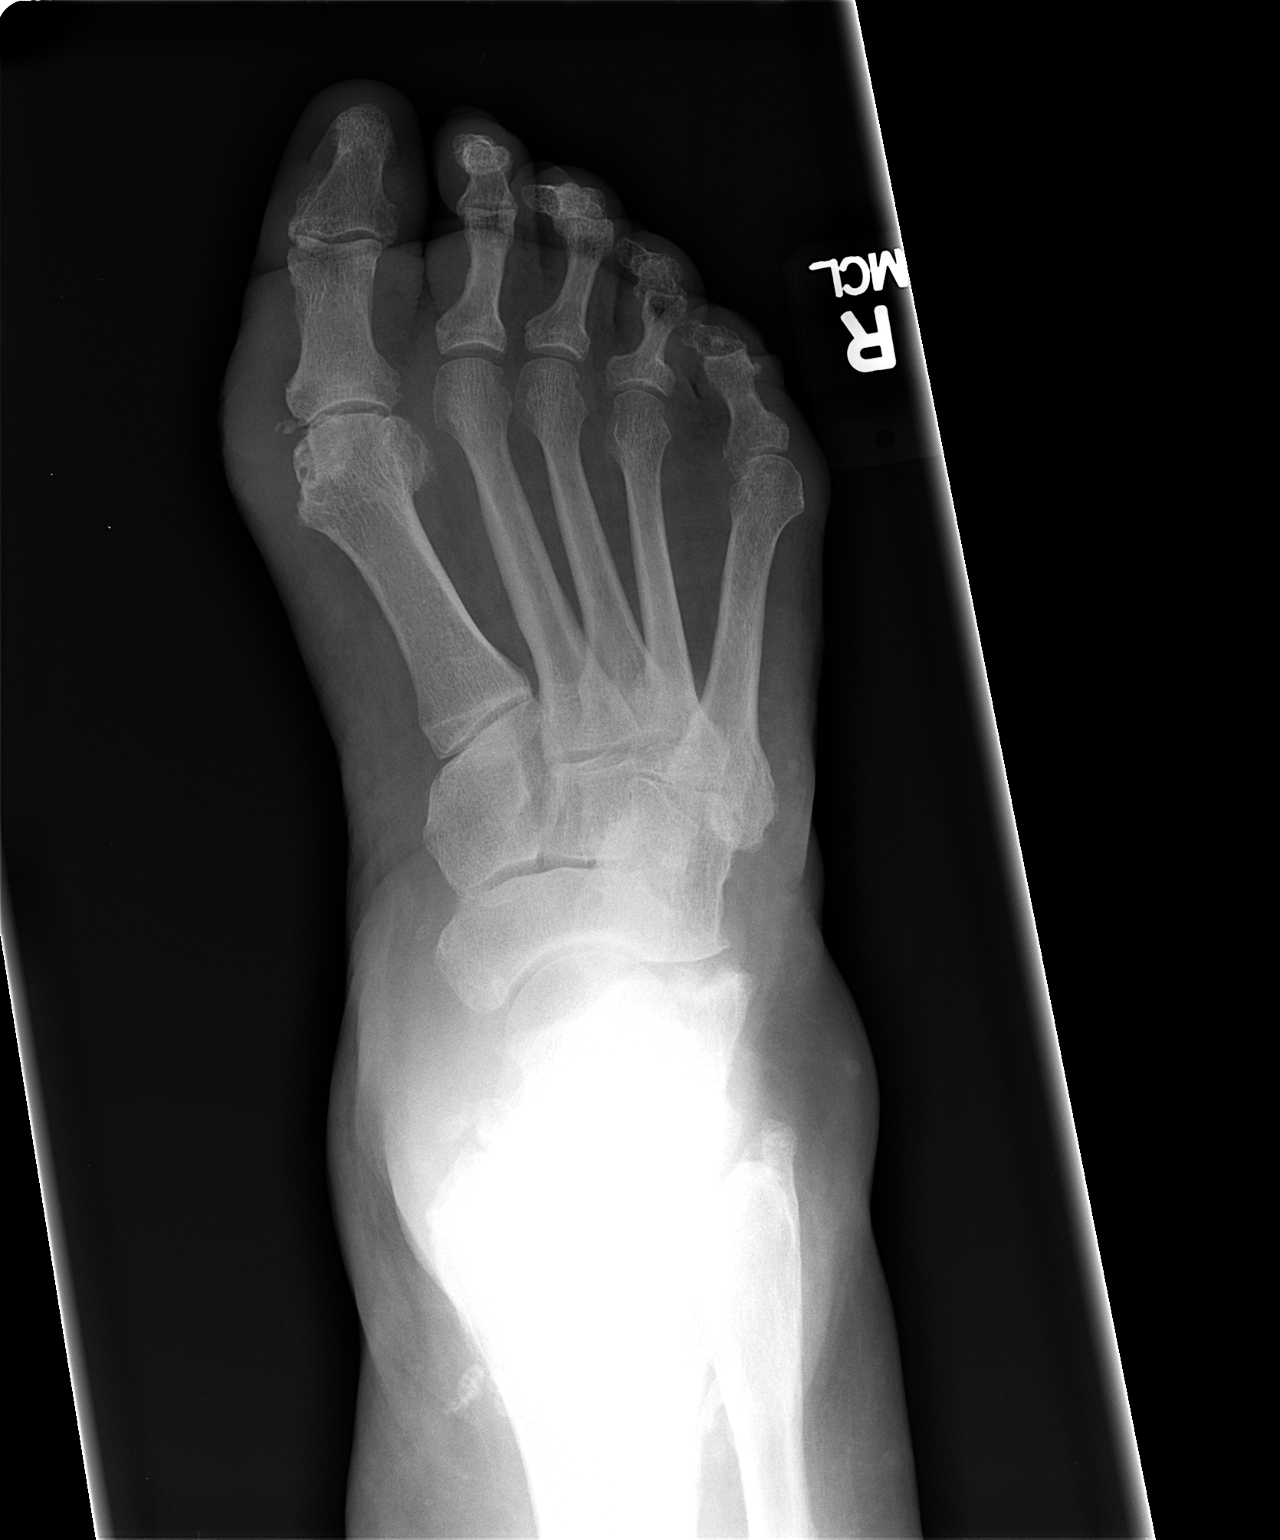

[view not recorded (2 of 2)]
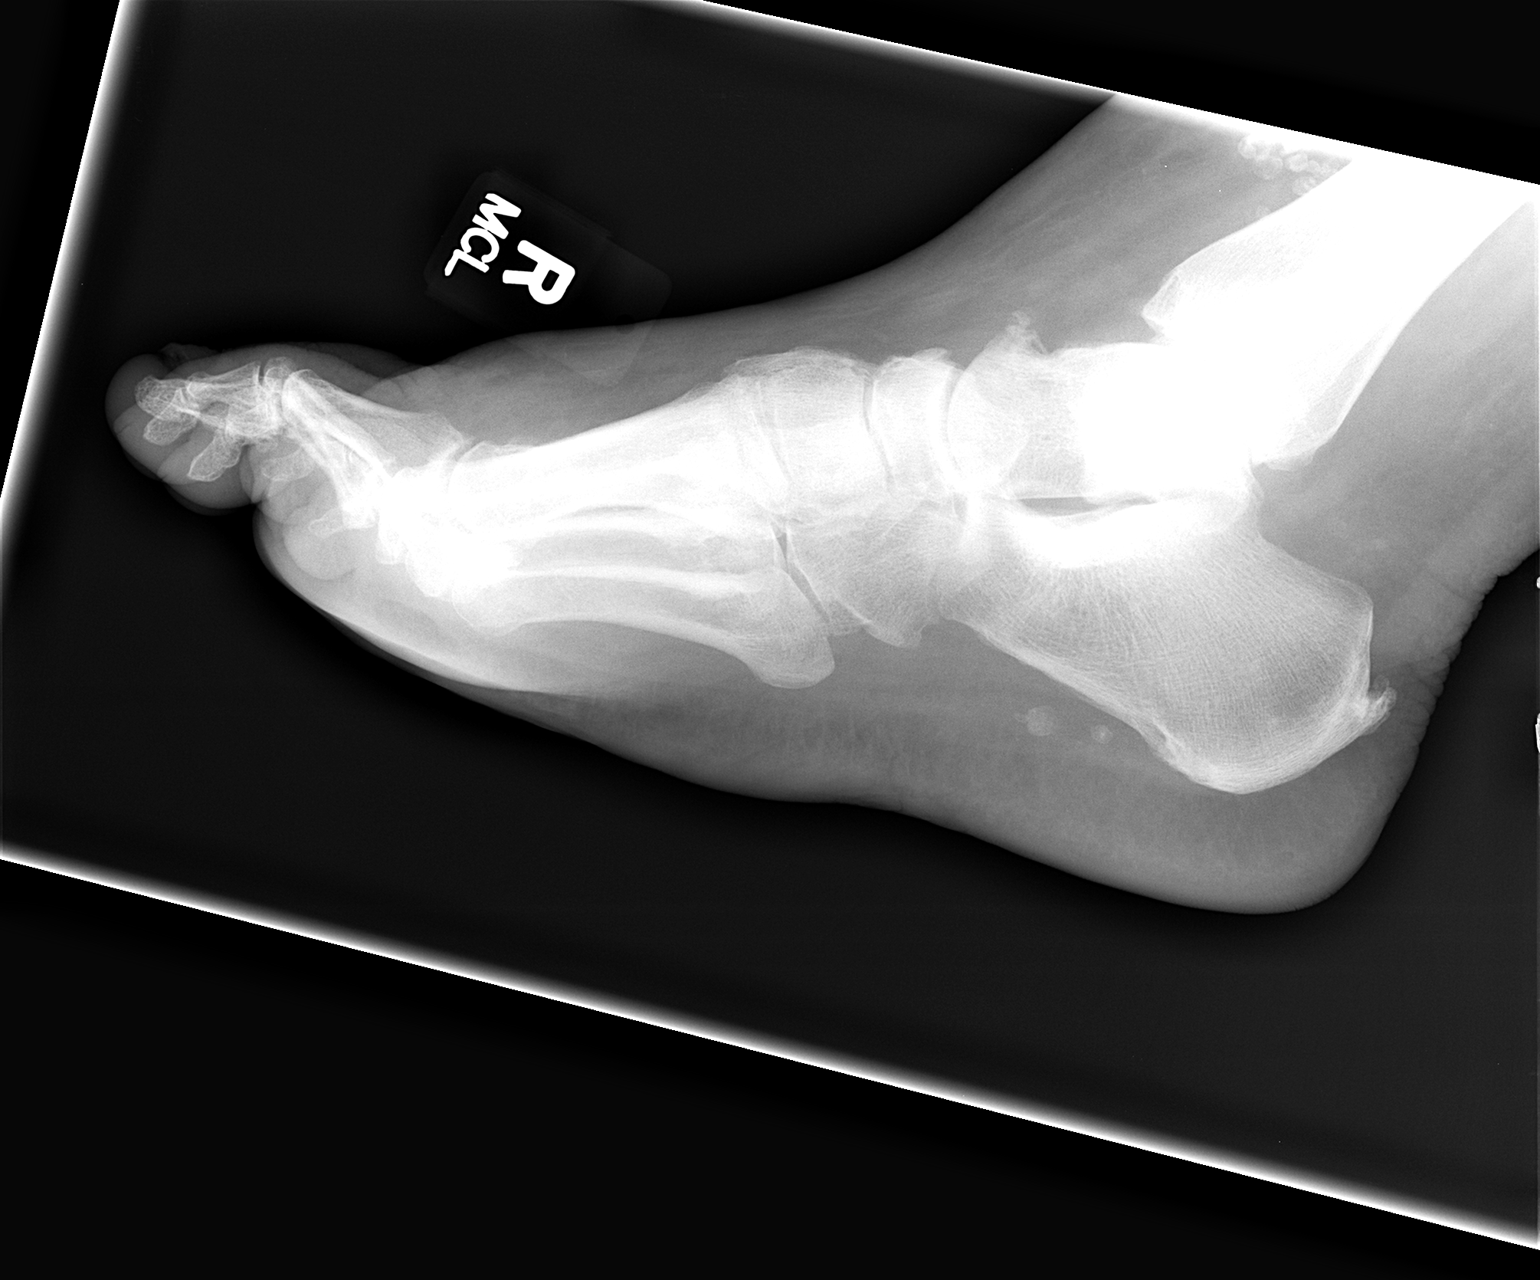

[2 of 2 positions shown; findings below may reference images not displayed]

FINDINGS: There are midfoot and forefoot degenerative changes but
no destructive bony changes or acute fracture.  Scattered soft
tissue calcifications are noted.  Degenerative changes of the first
metatarsal phalangeal joint with para-articular calcifications.
Overlying soft tissue swelling is noted.  Gout would be a
consideration.
IMPRESSION: 1.  Forefoot and midfoot degenerative changes.  Possible changes of
gout involving the first metatarsal phalangeal joint.
2.  No acute bony findings or destructive bony changes.

## 2017-09-20 ENCOUNTER — Emergency Department (HOSPITAL_COMMUNITY)
Admission: EM | Admit: 2017-09-20 | Discharge: 2017-09-20 | Disposition: A | Discharge status: Home / Self Care | Payer: Medicare Other | Attending: Emergency Medicine | Admitting: Emergency Medicine

## 2017-09-20 ENCOUNTER — Emergency Department (HOSPITAL_COMMUNITY): Payer: Medicare Other

## 2017-09-20 ENCOUNTER — Encounter (HOSPITAL_COMMUNITY): Payer: Self-pay | Admitting: *Deleted

## 2017-09-20 DIAGNOSIS — H60392 Other infective otitis externa, left ear: Secondary | ICD-10-CM

## 2017-09-20 DIAGNOSIS — M79605 Pain in left leg: Secondary | ICD-10-CM

## 2017-09-20 DIAGNOSIS — Z794 Long term (current) use of insulin: Secondary | ICD-10-CM | POA: Diagnosis not present

## 2017-09-20 DIAGNOSIS — E119 Type 2 diabetes mellitus without complications: Secondary | ICD-10-CM | POA: Insufficient documentation

## 2017-09-20 DIAGNOSIS — M7122 Synovial cyst of popliteal space [Baker], left knee: Secondary | ICD-10-CM | POA: Insufficient documentation

## 2017-09-20 DIAGNOSIS — I10 Essential (primary) hypertension: Secondary | ICD-10-CM | POA: Diagnosis not present

## 2017-09-20 HISTORY — DX: Synovial cyst of popliteal space (Baker), left knee: M71.22

## 2017-09-20 LAB — CBC
HCT: 35.9 % — ABNORMAL LOW (ref 36.0–46.0)
HEMOGLOBIN: 11.9 g/dL — AB (ref 12.0–15.0)
MCH: 30.4 pg (ref 26.0–34.0)
MCHC: 33.1 g/dL (ref 30.0–36.0)
MCV: 91.8 fL (ref 78.0–100.0)
PLATELETS: 277 10*3/uL (ref 150–400)
RBC: 3.91 MIL/uL (ref 3.87–5.11)
RDW: 12.6 % (ref 11.5–15.5)
WBC: 9.5 10*3/uL (ref 4.0–10.5)

## 2017-09-20 LAB — BASIC METABOLIC PANEL
Anion gap: 9 (ref 5–15)
BUN: 28 mg/dL — AB (ref 6–20)
CHLORIDE: 101 mmol/L (ref 101–111)
CO2: 27 mmol/L (ref 22–32)
Calcium: 9.1 mg/dL (ref 8.9–10.3)
Creatinine, Ser: 1.23 mg/dL — ABNORMAL HIGH (ref 0.44–1.00)
GFR, EST AFRICAN AMERICAN: 53 mL/min — AB (ref >60)
GFR, EST NON AFRICAN AMERICAN: 46 mL/min — AB (ref >60)
Glucose, Bld: 229 mg/dL — ABNORMAL HIGH (ref 65–99)
POTASSIUM: 4.7 mmol/L (ref 3.5–5.1)
SODIUM: 137 mmol/L (ref 135–145)

## 2017-09-20 MED ORDER — TRAMADOL HCL 50 MG PO TABS: 50.0000 mg | ORAL_TABLET | Freq: Four times a day (QID) | ORAL | 0 refills | Status: AC | PRN

## 2017-09-20 MED ORDER — TRAMADOL HCL 50 MG PO TABS
50.0000 mg | ORAL_TABLET | Freq: Once | ORAL | Status: AC
  Administered 2017-09-20: 50 mg via ORAL
  Filled 2017-09-20: qty 1

## 2017-09-20 MED ORDER — CIPROFLOXACIN-DEXAMETHASONE 0.3-0.1 % OT SUSP: 4.0000 [drp] | Freq: Two times a day (BID) | OTIC | 0 refills | Status: DC

## 2017-09-20 NOTE — ED Triage Notes (Signed)
Pt with left leg pain, recently dx with baker's cyst of left knee last Friday that ruptured.

## 2017-09-20 NOTE — ED Provider Notes (Signed)
[facility]Y DEPT Provider Note   CSN: [id number] Arrival date & time: 09/20/17  1640  History   Chief Complaint Chief Complaint  Patient presents with  . Leg Pain    HPI Theresa Estrada is a 61 y.o. female here with left leg pain from a ruptured baker's cyst. She states that last Thursday she was walking to her kitchen and felt a pop behind her left knee and had pain. She went to urgent care and they referred to her vascular/vein specialist who did an [place] and confirmed ruptured Baker's cyst. She was told the pain would subside. It has been a week and she reports lingering pain that makes it difficult for her to walk or sleep at night. She takes hydrocodone-acetaminophen TID from pain management for chronic pain. She has DM. She also is complaining of left ear pain and has been using ear drops twice a day for the past week without improvement.   HPI  Past Medical History:  Diagnosis Date  . AC (acromioclavicular) joint bone spurs to feet  . Arthritis   . Baker's cyst of knee, left   . Charcot foot due to diabetes mellitus (HCC) to both feet  . Complication of anesthesia   . Diabetes mellitus   . Hypercholesterolemia   . Hypertension   . Neuromuscular disorder (HCC)   . Peripheral vascular disease (HCC)     There are no active problems to display for this patient.   Past Surgical History:  Procedure Laterality Date  .  broken collarbone    . CHOLECYSTECTOMY      OB History    No data available       Home Medications    Prior to Admission medications   Medication Sig Start Date End Date Taking? Authorizing Provider  Cholecalciferol (VITAMIN D3) 2000 units TABS Take 1 capsule by mouth daily.   Yes [provider]  clonazePAM (KLONOPIN) 0.5 MG tablet Take 0.5 mg by mouth 3 (three) times daily. Take 3 tablets every day by translingual route.   Yes [provider]  ergocalciferol (VITAMIN D2) 50000 units capsule Take 2 capsules by mouth once a week.    Yes [provider]  furosemide (LASIX) 20 MG tablet Take 20 mg by mouth daily.   Yes [provider]  gabapentin (NEURONTIN) 800 MG tablet Take 800 mg by mouth 3 (three) times daily.   Yes [provider]  insulin aspart (NOVOLOG FLEXPEN) 100 UNIT/ML FlexPen Inject into the skin 3 (three) times daily with meals.   Yes [provider]  insulin detemir (LEVEMIR) 100 unit/ml SOLN Inject 12 Units into the skin at bedtime.   Yes [provider]  levothyroxine (SYNTHROID, LEVOTHROID) 175 MCG tablet Take 175 mcg by mouth daily before breakfast.   Yes [provider]  lisinopril (PRINIVIL,ZESTRIL) 20 MG tablet Take 20 mg by mouth daily.   Yes [provider]  neomycin-polymyxin b-dexamethasone (MAXITROL) 3.5-10000-0.1 SUSP 4 drops. Instill 4 drops into affected ear by otic route 3 times per day.   Yes [provider]  oxyCODONE-acetaminophen (PERCOCET) 10-325 MG tablet Take 3 tablets by mouth daily.   Yes [provider]  simvastatin (ZOCOR) 20 MG tablet Take 20 mg by mouth daily.   Yes [provider]  tiZANidine (ZANAFLEX) 4 MG tablet Take 4 mg by mouth every 4 (four) hours.   Yes [provider]  ciprofloxacin-dexamethasone (CIPRODEX) OTIC suspension Place 4 drops into the left ear 2 (two) times daily.  09/20/17   Tillman Sers, DO  HYDROcodone-acetaminophen (LORTAB) 7.5-500 MG per tablet Take 1 tablet by mouth 3 (three) times daily as needed. For pain     [provider]  insulin aspart (NOVOLOG) 100 UNIT/ML injection Inject 15 Units into the skin 3 (three) times daily with meals. 09/14/11 09/13/12  Avon Gully, MD  insulin glargine (LANTUS) 100 UNIT/ML injection Inject 70 Units into the skin at bedtime. 09/14/11 09/13/12  Avon Gully, MD  traMADol (ULTRAM) 50 MG tablet Take 1 tablet (50 mg total) by mouth every 6 (six) hours as needed. 09/20/17 09/20/18  Tillman Sers, DO    Family  History History reviewed. No pertinent family history.  Social History Social History  Substance Use Topics  . Smoking status: Never Smoker  . Smokeless tobacco: Never Used  . Alcohol use No     Allergies   Morphine and related   Review of Systems Review of Systems  Constitutional: Positive for activity change and chills. Negative for appetite change, fatigue and fever.  HENT: Positive for ear pain. Negative for congestion, facial swelling, rhinorrhea and sore throat.   Eyes: Negative for discharge and visual disturbance.  Respiratory: Negative for cough, shortness of breath and wheezing.   Cardiovascular: Positive for leg swelling. Negative for chest pain and palpitations.  Gastrointestinal: Positive for nausea. Negative for abdominal pain, constipation, diarrhea and vomiting.  Genitourinary: Negative for difficulty urinating, dysuria, flank pain, hematuria and urgency.  Musculoskeletal: Positive for back pain. Negative for arthralgias and myalgias.  Skin: Negative for rash and wound.  Neurological: Negative for dizziness, weakness, light-headedness, numbness and headaches.     Physical Exam Updated Vital Signs BP (!) 151/66   Pulse 72   Temp 98 F (36.7 C) (Oral)   Resp 19   Ht  (1.753 m)   Wt 136.1 kg (300 lb)   LMP 02/09/2011   SpO2 93%   BMI 44.30 kg/m   Physical Exam  Constitutional: She appears well-developed. She is cooperative.  HENT:  Head: Normocephalic.  Right Ear: External ear normal. No tenderness.  Left Ear: External ear normal. There is swelling and tenderness.  Nose: Nose normal.  Mouth/Throat: Oropharynx is clear and moist.  Inflammation in left ear canal with yellow exudate present  Eyes: Pupils are equal, round, and reactive to light. EOM are normal.  Neck: Normal range of motion. Neck supple.  Cardiovascular: Normal rate and regular rhythm.   Pulmonary/Chest: Effort normal and breath sounds normal.  Abdominal: Soft. She exhibits no  distension. There is no tenderness.  Musculoskeletal: Normal range of motion.       Right knee: Normal. She exhibits normal range of motion, no swelling and no effusion. No tenderness found.       Left knee: She exhibits normal range of motion, no swelling, no ecchymosis and no deformity. Tenderness found.  Neurological: She is alert. She exhibits normal muscle tone.  Skin: Skin is warm and dry.     ED Treatments / Results  Labs (all labs ordered are listed, but only abnormal results are displayed) Labs Reviewed  CBC - Abnormal; Notable for the following:       Result Value   Hemoglobin 11.9 (*)    HCT 35.9 (*)    All other components within normal limits  BASIC METABOLIC PANEL - Abnormal; Notable for the following:    Glucose, Bld 229 (*)    BUN 28 (*)    Creatinine, Ser 1.23 (*)  GFR calc non Af Amer 46 (*)    GFR calc Af Amer 53 (*)    All other components within normal limits    EKG  EKG Interpretation None       Radiology Ct Head Wo Contrast  Result Date: 09/20/2017 CLINICAL DATA:  Ear pain EXAM: CT HEAD WITHOUT CONTRAST TECHNIQUE: Contiguous axial images were obtained from the base of the skull through the vertex without intravenous contrast. COMPARISON:  None. FINDINGS: Brain: No mass lesion, intraparenchymal hemorrhage or extra-axial collection. No evidence of acute cortical infarct. Brain parenchyma and CSF-containing spaces are normal for age. Vascular: Atherosclerotic calcification of the vertebral and internal carotid arteries at the skull base. Skull: Normal visualized skull base, calvarium and extracranial soft tissues. Sinuses/Orbits: No sinus fluid levels or advanced mucosal thickening. No mastoid effusion. Normal orbits. IMPRESSION: 1. Normal brain. 2. No mastoid or middle ear effusion. Electronically Signed   By: Deatra Robinson M.D.   On: 09/20/2017 19:55    Procedures Procedures (including critical care time)  Medications Ordered in ED Medications    traMADol (ULTRAM) tablet 50 mg (50 mg Oral Given 09/20/17 1912)     Initial Impression / Assessment and Plan / ED Course  I have reviewed the triage vital signs and the nursing notes.  Pertinent labs & imaging results that were available during my care of the patient were reviewed by me and considered in my medical decision making (see chart for details).     61 year old female with DM, CKD3, chronic pain presenting with persistent left leg pain after ruptured baker's cyst. Also complaining of persistent ear infection on the left despite 1 week of polymyxin ear drops. Concern for malignant otitis externa so CT head obtained which was negative. Will change ear drops to Ciprodex. Given Tramadol for pain. Referral made to sports med for possible cyst aspiration and follow up of left leg pain. Patient stable for discharge home. Patient verbalized understanding and agreement with plan.   Final Clinical Impressions(s) / ED Diagnoses   Final diagnoses:  Other infective acute otitis externa of left ear  Left leg pain    New Prescriptions New Prescriptions   CIPROFLOXACIN-DEXAMETHASONE (CIPRODEX) OTIC SUSPENSION    Place 4 drops into the left ear 2 (two) times daily.   TRAMADOL (ULTRAM) 50 MG TABLET    Take 1 tablet (50 mg total) by mouth every 6 (six) hours as needed.     Tillman Sers, DO 09/20/17 2007    Blane Ohara, MD 09/20/17 [phone]

## 2017-09-20 NOTE — ED Notes (Signed)
Dr Z in to assess 

## 2017-09-20 NOTE — ED Notes (Signed)
Pt seen at [facility] last week  DxD with baker's cyst that had ruptured-   Reports that she has pain to her leg that is unbearable and that she takes narcotic pain meds TID from her physician but cannot stand the pain

## 2017-10-15 ENCOUNTER — Ambulatory Visit: Payer: Medicare Other | Admitting: Family Medicine

## 2017-11-12 ENCOUNTER — Ambulatory Visit: Payer: Self-pay

## 2017-11-12 ENCOUNTER — Ambulatory Visit (INDEPENDENT_AMBULATORY_CARE_PROVIDER_SITE_OTHER): Payer: Medicare Other | Admitting: Family Medicine

## 2017-11-12 ENCOUNTER — Encounter: Payer: Self-pay | Admitting: Family Medicine

## 2017-11-12 VITALS — BP 147/61 | Ht 69.0 in | Wt 310.0 lb

## 2017-11-12 DIAGNOSIS — M25462 Effusion, left knee: Secondary | ICD-10-CM | POA: Diagnosis not present

## 2017-11-12 DIAGNOSIS — S83282A Other tear of lateral meniscus, current injury, left knee, initial encounter: Secondary | ICD-10-CM

## 2017-11-12 DIAGNOSIS — M25562 Pain in left knee: Secondary | ICD-10-CM

## 2017-11-12 DIAGNOSIS — R262 Difficulty in walking, not elsewhere classified: Secondary | ICD-10-CM

## 2017-11-12 MED ORDER — METHYLPREDNISOLONE ACETATE 40 MG/ML IJ SUSP
40.0000 mg | Freq: Once | INTRAMUSCULAR | Status: AC
  Administered 2017-11-12: 40 mg via INTRA_ARTICULAR

## 2017-11-12 NOTE — Patient Instructions (Signed)
Today you received an injection with corticosteroid. This injection is usually done in response to pain and inflammation. There is some "numbing" medicine also in the shot so the injected area may be numb and feel really good for the next couple of hours. The numbing medicine usually wears off in 2-3 hours though, and then your pain level will be right back where it was before the injection.   The actually benefit from the steroid injection is usually noticed in 2-7 days. You may actually experience a small (as in 10%) INCREASE in pain in the first 24 hours---that is common.   Things to watch out for that you should contact us or a health care provider urgently would include: 1. Unusual (as in more than 10%) increase in pain 2. New fever > 101.5 3. New swelling or redness of the injected area.  4. Streaking of red lines around the area injected.  Let me see you back in 2 weeks

## 2017-11-13 ENCOUNTER — Encounter: Payer: Self-pay | Admitting: Family Medicine

## 2017-11-13 DIAGNOSIS — M25462 Effusion, left knee: Secondary | ICD-10-CM | POA: Insufficient documentation

## 2017-11-15 NOTE — Progress Notes (Signed)
Theresa Estrada - 61 y.o. female MRN [id number]  Date of birth: 1956-09-20    SUBJECTIVE:      Chief Complaint:/ HPI:   LEFT knee pain September  3rd week  she was walking to her kitchen and felt a pop in poe\sterior part of her knee. Instant onset of pain taht did not resolve. Had knee swelling. [facility] and had venous doppler revealing ruptured Baker's cyst. Pain has continued. She is barely able to walk using a friend's walker....essentially hobbles. Cannot bend knee. Pain is 6-8 /10. No calf pain.  ROS:     Pertinent review of systems: negative for fever or unusual weight change.  Knee pain as per HPI. No other unusual arthralgias. No redness of LLE.  No SOB.  PERTINENT  PMH / PSH FH / / SH:  Past Medical, Surgical, Social, and Family History Reviewed & Updated in the EMR.  Pertinent findings include:  DM with Charcot foot (left) HTN Hypercholesterolemia No hx of prior injury or surgery to her knee on the left Never smoker No alcohol No illicits FH noncontributory   OBJECTIVE: BP (!) 147/61   Ht 5\' 9"  (1.753 m)   Wt (!) 310 lb (140.6 kg)   LMP 02/09/2011   BMI 45.78 kg/m   Physical Exam:  Vital signs are reviewed. GEN WD WN Obese female, walking with cane and difficulty KNEE: bilaterally asymmetrical with effusion of LEFT. Left knee held in extension. I can passively get her to bend it about 20 degrees but then her pain level significantly increases. Patella appers located and ligaentously intact to varus and valgus stress although exam is limited. Popliteal space is full and mildly tender to palpation. No specific mass is noted Calf bilaterlally is soft, no mass, negative Homans.  SKIN no unusual warmth or erethema LLE.  No rash VASC: DP pulses 1-2+ B=. Left foot charcot joint ankle but not tender to palpation. Small nontender nodule plantar portion of left foot/midfoot.  US: Knee LEFT:  Exam limited by her inability to flex knee more than 20 degrees secondary to  pain. Patellar and quadricept tendons are intact, patella is located. Lateral and medial meniscus are not seen well and there is fluid noted at joint line. The popliteal space reveals a multiloculated backer cyst with some debris floating within. The popliteal artery is identified and is entirely separate from the cystic structure. The artery appears normal with no sign of aneurysm or wall defects.  PROCEDURE: ASPIRATION / INJECTION LEFT KNEE BAKER'S CYST: Patient was given informed consent, signed copy in the chart. Appropriate time out was taken. Area prepped and draped in usual sterile fashion. Sterile probe cover and sterile US gel used. Ethyl chloride was  used for local anesthesia. A 21 gauge 1 1/2 inch needle was used and 5 cc of 1% lidocaine without epinephrine was used for local anesthesia. Under US guidance, an 18 gauge 1 1/2 inch needle attached to a  60 cc syringe was used to aspirate 25 cc of clear blood tinged fluid. The needle was  Easily identified within the cyst. The syringe was removed using hemostats and replaced with a sterile syringe with 1 cc of . of methylprednisolone 40 mg/ml plus  1 cc of 1% lidocaine without epinephrine was injected into the Baker's cyst. using a(n) US approach. All equipment was removed, ultrasound gel cleaned off and a  bandaid was applied to the aspiration / injection site.  The patient tolerated the procedure well. There were  no complications. Post procedure instructions were given.     ASSESSMENT & PLAN:  See problem based charting & AVS for pt instructions.

## 2017-11-15 NOTE — Assessment & Plan Note (Signed)
Baker's cyst I suspect she has a torn meniscus, likely degenerative in nature. Given her inability to bend knee, I think we need to proceed with MRI as I suspect she will need arthroscopic procedure. Hopefully she will get some improvement from aspiration/ injection today. F/u 2 weeks, sooner with problems.

## 2017-11-18 ENCOUNTER — Telehealth: Payer: Self-pay | Admitting: *Deleted

## 2017-11-18 NOTE — Telephone Encounter (Signed)
Have twice faxed Rx for wheelchair to [facility] at [phone]. Both times received transmission failure. Called patient who asked me to call her daughter, Tresa EndoKelly, at [phone] to find out phone number of [facility] to verify correct fax number. LM on [facility] VM requesting return call. Kinnie FeilL. Jari Dipasquale, RN, BSN

## 2017-11-18 NOTE — Telephone Encounter (Signed)
Tresa EndoKelly returned call. [facility] [phone]. Re-faxed to corrected number. [facility] phone is [phone]. Kinnie FeilL. Yacine Garriga, RN, BSN

## 2017-11-19 ENCOUNTER — Encounter: Payer: Self-pay | Admitting: *Deleted

## 2017-11-22 NOTE — Telephone Encounter (Signed)
Faxed most recent OV notes (11/12/2017) [facility] [phone] for w/c request. Kinnie FeilL. [place], RN, BSN

## 2017-11-23 ENCOUNTER — Ambulatory Visit
Admission: RE | Admit: 2017-11-23 | Discharge: 2017-11-23 | Disposition: A | Discharge status: Home / Self Care | Payer: Medicare Other | Source: Ambulatory Visit | Attending: Family Medicine | Admitting: Family Medicine

## 2017-11-23 DIAGNOSIS — M25562 Pain in left knee: Secondary | ICD-10-CM

## 2017-11-25 ENCOUNTER — Telehealth: Payer: Self-pay | Admitting: Family Medicine

## 2017-11-25 NOTE — Telephone Encounter (Signed)
[place] This is the one I spoke with you about She needs an ortho consult ASAP THANKS! Denny LevySara Tobin Witucki

## 2017-11-26 NOTE — Telephone Encounter (Signed)
[facility] [address] Elsie LincolnPeter Daldorf [address], [place], [place] [address] [phone]  Friday 11/29/17 at 11am Arrival time is 1030am

## 2017-11-29 ENCOUNTER — Ambulatory Visit: Payer: Medicare Other | Admitting: Family Medicine

## 2018-10-15 ENCOUNTER — Emergency Department (HOSPITAL_COMMUNITY)
Admission: EM | Admit: 2018-10-15 | Discharge: 2018-10-16 | Disposition: A | Discharge status: Home / Self Care | Payer: Medicare Other | Attending: Emergency Medicine | Admitting: Emergency Medicine

## 2018-10-15 ENCOUNTER — Other Ambulatory Visit: Payer: Self-pay

## 2018-10-15 ENCOUNTER — Encounter (HOSPITAL_COMMUNITY): Payer: Self-pay | Admitting: Emergency Medicine

## 2018-10-15 DIAGNOSIS — Z79899 Other long term (current) drug therapy: Secondary | ICD-10-CM | POA: Insufficient documentation

## 2018-10-15 DIAGNOSIS — E860 Dehydration: Secondary | ICD-10-CM

## 2018-10-15 DIAGNOSIS — I10 Essential (primary) hypertension: Secondary | ICD-10-CM | POA: Insufficient documentation

## 2018-10-15 DIAGNOSIS — Z794 Long term (current) use of insulin: Secondary | ICD-10-CM | POA: Diagnosis not present

## 2018-10-15 DIAGNOSIS — E1151 Type 2 diabetes mellitus with diabetic peripheral angiopathy without gangrene: Secondary | ICD-10-CM | POA: Diagnosis not present

## 2018-10-15 DIAGNOSIS — R112 Nausea with vomiting, unspecified: Secondary | ICD-10-CM

## 2018-10-15 DIAGNOSIS — R197 Diarrhea, unspecified: Secondary | ICD-10-CM

## 2018-10-15 LAB — COMPREHENSIVE METABOLIC PANEL
ALT: 14 U/L (ref 0–44)
ANION GAP: 6 (ref 5–15)
AST: 13 U/L — ABNORMAL LOW (ref 15–41)
Albumin: 3.4 g/dL — ABNORMAL LOW (ref 3.5–5.0)
Alkaline Phosphatase: 65 U/L (ref 38–126)
BILIRUBIN TOTAL: 0.6 mg/dL (ref 0.3–1.2)
BUN: 24 mg/dL — AB (ref 8–23)
CALCIUM: 8.7 mg/dL — AB (ref 8.9–10.3)
CO2: 26 mmol/L (ref 22–32)
Chloride: 102 mmol/L (ref 98–111)
Creatinine, Ser: 1.31 mg/dL — ABNORMAL HIGH (ref 0.44–1.00)
GFR calc Af Amer: 49 mL/min — ABNORMAL LOW (ref >60)
GFR, EST NON AFRICAN AMERICAN: 42 mL/min — AB (ref >60)
Glucose, Bld: 146 mg/dL — ABNORMAL HIGH (ref 70–99)
Potassium: 4.4 mmol/L (ref 3.5–5.1)
Sodium: 134 mmol/L — ABNORMAL LOW (ref 135–145)
TOTAL PROTEIN: 6.9 g/dL (ref 6.5–8.1)

## 2018-10-15 LAB — CBC
HCT: 36.1 % (ref 36.0–46.0)
HEMOGLOBIN: 11.6 g/dL — AB (ref 12.0–15.0)
MCH: 29.1 pg (ref 26.0–34.0)
MCHC: 32.1 g/dL (ref 30.0–36.0)
MCV: 90.5 fL (ref 80.0–100.0)
PLATELETS: 276 10*3/uL (ref 150–400)
RBC: 3.99 MIL/uL (ref 3.87–5.11)
RDW: 12.3 % (ref 11.5–15.5)
WBC: 9.5 10*3/uL (ref 4.0–10.5)
nRBC: 0 % (ref 0.0–0.2)

## 2018-10-15 LAB — LIPASE, BLOOD: Lipase: 21 U/L (ref 11–51)

## 2018-10-15 MED ORDER — SODIUM CHLORIDE 0.9 % IV BOLUS
1000.0000 mL | Freq: Once | INTRAVENOUS | Status: AC
  Administered 2018-10-15: 1000 mL via INTRAVENOUS

## 2018-10-15 MED ORDER — ONDANSETRON HCL 4 MG/2ML IJ SOLN
4.0000 mg | Freq: Once | INTRAMUSCULAR | Status: AC
  Administered 2018-10-15: 4 mg via INTRAVENOUS
  Filled 2018-10-15: qty 2

## 2018-10-15 NOTE — ED Triage Notes (Signed)
Patient reports emesis and diarrhea since yesterday. Family member has had viral gastroenteritis.

## 2018-10-16 MED ORDER — ONDANSETRON HCL 4 MG PO TABS: 4.0000 mg | ORAL_TABLET | Freq: Four times a day (QID) | ORAL | 0 refills | Status: AC

## 2018-10-16 NOTE — ED Provider Notes (Signed)
Theresa Estrada Provider Note   CSN: [id number] Arrival date & time: 10/15/18  2109     History   Chief Complaint Chief Complaint  Patient presents with  . Emesis    HPI Theresa Estrada is a 62 y.o. female.  She is here with nausea vomiting and diarrhea that is been going on for over 24 hours.  She had a sick contact with a family member who had something like this a few days ago.  There is been no fever but she is feeling generally weak all over.  No blood in the vomit or diarrhea.  Is associate with some crampy abdominal pain before she has the diarrhea.  She has not been able to take any p.o. secondary to her symptoms.  She does suffer from some chronic diarrhea as a baseline.  No urinary symptoms.  She is worried because she is a diabetic and her sugars have been running high during this illness  The history is provided by the patient.  Emesis   This is a new problem. The current episode started yesterday. The problem occurs more than 10 times per day. The problem has not changed since onset.The emesis has an appearance of stomach contents. There has been no fever. Associated symptoms include abdominal pain and diarrhea. Pertinent negatives include no fever. Risk factors include ill contacts.    Past Medical History:  Diagnosis Date  . AC (acromioclavicular) joint bone spurs to feet  . Arthritis   . Baker's cyst of knee, left   . Charcot foot due to diabetes mellitus (HCC) to both feet  . Complication of anesthesia   . Diabetes mellitus   . Hypercholesterolemia   . Hypertension   . Neuromuscular disorder (HCC)   . Peripheral vascular disease [facility])     Patient Active Problem List   Diagnosis Date Noted  . Knee effusion, left 11/13/2017    Past Surgical History:  Procedure Laterality Date  .  broken collarbone    . CHOLECYSTECTOMY       OB History    Gravida  2   Para  2   Term  2   Preterm      AB      Living        SAB      TAB      Ectopic      Multiple      Live Births               Home Medications    Prior to Admission medications   Medication Sig Start Date End Date Taking? Authorizing Provider  Cholecalciferol (VITAMIN D3) 2000 units TABS Take 1 capsule by mouth daily.   Yes [provider]  Cholecalciferol (VITAMIN D3) 50000 units CAPS Take 1 capsule by mouth 2 (two) times a week. Wednesdays and Saturdays   Yes [provider]  clonazePAM (KLONOPIN) 0.5 MG tablet Take 0.5 mg by mouth 3 (three) times daily.    Yes [provider]  furosemide (LASIX) 20 MG tablet Take 20 mg by mouth daily.   Yes [provider]  gabapentin (NEURONTIN) 800 MG tablet Take 800 mg by mouth 3 (three) times daily.   Yes [provider]  insulin aspart (NOVOLOG FLEXPEN) 100 UNIT/ML FlexPen Inject 1-20 Units into the skin 3 (three) times daily with meals. Based on sliding scale   Yes [provider]  insulin detemir (LEVEMIR) 100 unit/ml SOLN Inject 75 Units into the  skin at bedtime.    Yes [provider]  Iron-Vit C-Vit B12-Folic Acid (IRON 100 PLUS PO) Take 1 tablet by mouth daily.   Yes [provider]  levothyroxine (SYNTHROID, LEVOTHROID) 150 MCG tablet Take 150 mcg by mouth daily before breakfast.    Yes [provider]  lisinopril (PRINIVIL,ZESTRIL) 20 MG tablet Take 20 mg by mouth daily.   Yes [provider]  loperamide (IMODIUM) 2 MG capsule Take 2 mg by mouth as needed for diarrhea or loose stools.   Yes [provider]  oxyCODONE-acetaminophen (PERCOCET) 10-325 MG tablet Take 1 tablet by mouth 3 (three) times daily.    Yes [provider]  simvastatin (ZOCOR) 20 MG tablet Take 20 mg by mouth daily.   Yes [provider]  insulin aspart (NOVOLOG) 100 UNIT/ML injection Inject 15 Units into the skin 3 (three) times daily with meals. 09/14/11 09/13/12  Avon Gully, MD  insulin glargine (LANTUS) 100 UNIT/ML  injection Inject 70 Units into the skin at bedtime. 09/14/11 09/13/12  Avon Gully, MD    Family History Family History  Problem Relation Age of Onset  . Atrial fibrillation Mother   . Cancer Father     Social History Social History   Tobacco Use  . Smoking status: Never Smoker  . Smokeless tobacco: Never Used  Substance Use Topics  . Alcohol use: No  . Drug use: No     Allergies   Zoloft [sertraline] and Morphine and related   Review of Systems Review of Systems  Constitutional: Negative for fever.  HENT: Negative for sore throat.   Eyes: Negative for visual disturbance.  Respiratory: Negative for shortness of breath.   Cardiovascular: Negative for chest pain.  Gastrointestinal: Positive for abdominal pain, diarrhea and vomiting.  Genitourinary: Negative for dysuria.  Musculoskeletal: Positive for back pain.  Skin: Negative for rash.  Neurological: Positive for light-headedness.     Physical Exam Updated Vital Signs BP (!) 141/65 (BP Location: Right Arm)   Pulse 62   Temp 98.6 F (37 C) (Oral)   Resp 20   Ht 5\' 9"  (1.753 m)   Wt 131.5 kg   LMP 02/09/2011   SpO2 96%   BMI 42.83 kg/m   Physical Exam  Constitutional: She is oriented to person, place, and time. She appears well-developed and well-nourished. No distress.  HENT:  Head: Normocephalic and atraumatic.  Eyes: Conjunctivae are normal.  Neck: Neck supple.  Cardiovascular: Normal rate and regular rhythm.  No murmur heard. Pulmonary/Chest: Effort normal and breath sounds normal. No respiratory distress.  Abdominal: Soft. She exhibits no mass. There is no tenderness. There is no guarding.  Musculoskeletal: She exhibits no deformity.  Neurological: She is alert and oriented to person, place, and time.  Skin: Skin is warm and dry. Capillary refill takes less than 2 seconds.  Psychiatric: She has a normal mood and affect.  Nursing note and vitals reviewed.    ED Treatments / Results   Labs (all labs ordered are listed, but only abnormal results are displayed) Labs Reviewed  COMPREHENSIVE METABOLIC PANEL - Abnormal; Notable for the following components:      Result Value   Sodium 134 (*)    Glucose, Bld 146 (*)    BUN 24 (*)    Creatinine, Ser 1.31 (*)    Calcium 8.7 (*)    Albumin 3.4 (*)    AST 13 (*)    GFR calc non Af Amer 42 (*)  GFR calc Af Amer 49 (*)    All other components within normal limits  CBC - Abnormal; Notable for the following components:   Hemoglobin 11.6 (*)    All other components within normal limits  LIPASE, BLOOD  URINALYSIS, ROUTINE W REFLEX MICROSCOPIC    EKG None  Radiology No results found.  Procedures Procedures (including critical care time)  Medications Ordered in ED Medications  sodium chloride 0.9 % bolus 1,000 mL (0 mLs Intravenous Stopped 10/15/18 2338)  ondansetron (ZOFRAN) injection 4 mg (4 mg Intravenous Given 10/15/18 2227)     Initial Impression / Assessment and Plan / ED Course  I have reviewed the triage vital signs and the nursing notes.  Pertinent labs & imaging results that were available during my care of the patient were reviewed by me and considered in my medical decision making (see chart for details).  Clinical Course as of Oct 16 1  Wed Oct 15, 2018  2352 Patient's creatinine is a little up and so likely there is some element of dehydration.  Rest of her lab work fairly unremarkable.  We will reevaluate her symptoms.   [MB]  Thu Oct 16, 2018  0001 Patient is feeling better and has had no further vomiting or diarrhea here.  She is asking to be discharged with some nausea medicine which I think is reasonable.  I told her to keep up with the Imodium as needed.   [MB]    Clinical Course User Index [MB] Terrilee Files, MD     Final Clinical Impressions(s) / ED Diagnoses   Final diagnoses:  Nausea vomiting and diarrhea  Dehydration    ED Discharge Orders         Ordered     ondansetron (ZOFRAN) 4 MG tablet  Every 6 hours     10/16/18 0004           Terrilee Files, MD 10/16/18 1032

## 2018-10-16 NOTE — Discharge Instructions (Addendum)
Your evaluated in the emergency department for nausea vomiting diarrhea and dehydration.  He received some IV fluids here with some improvement in your symptoms.  We are sending you home with a prescription for some nausea medication and you should continue the Imodium.  Please follow-up with your doctor and return if any worsening symptoms.

## 2020-01-25 DEATH — deceased
